# Patient Record
Sex: Female | Born: 1945
Health system: Southern US, Community
[De-identification: ages and names within clinical notes are randomized; demographics above are authoritative.]

## PROBLEM LIST (undated history)

## (undated) DIAGNOSIS — T7840XA Allergy, unspecified, initial encounter: Secondary | ICD-10-CM

## (undated) DIAGNOSIS — R42 Dizziness and giddiness: Secondary | ICD-10-CM

## (undated) DIAGNOSIS — H409 Unspecified glaucoma: Secondary | ICD-10-CM

## (undated) DIAGNOSIS — K635 Polyp of colon: Secondary | ICD-10-CM

## (undated) DIAGNOSIS — Z8673 Personal history of transient ischemic attack (TIA), and cerebral infarction without residual deficits: Secondary | ICD-10-CM

## (undated) DIAGNOSIS — M1990 Unspecified osteoarthritis, unspecified site: Secondary | ICD-10-CM

## (undated) DIAGNOSIS — M4850XA Collapsed vertebra, not elsewhere classified, site unspecified, initial encounter for fracture: Secondary | ICD-10-CM

## (undated) DIAGNOSIS — K388 Other specified diseases of appendix: Secondary | ICD-10-CM

## (undated) HISTORY — DX: Collapsed vertebra, not elsewhere classified, site unspecified, initial encounter for fracture: M48.50XA

## (undated) HISTORY — DX: Unspecified glaucoma: H40.9

## (undated) HISTORY — PX: TUBAL LIGATION: SHX77

## (undated) HISTORY — DX: Allergy, unspecified, initial encounter: T78.40XA

## (undated) HISTORY — PX: EYE SURGERY: SHX253

## (undated) HISTORY — PX: APPENDECTOMY: SHX54

## (undated) HISTORY — DX: Polyp of colon: K63.5

## (undated) HISTORY — DX: Unspecified osteoarthritis, unspecified site: M19.90

---

## 2011-02-24 HISTORY — PX: COLONOSCOPY: SHX174

## 2011-02-24 LAB — HM COLONOSCOPY

## 2013-01-13 DIAGNOSIS — H401190 Primary open-angle glaucoma, unspecified eye, stage unspecified: Secondary | ICD-10-CM | POA: Insufficient documentation

## 2015-10-21 DIAGNOSIS — H3093 Unspecified chorioretinal inflammation, bilateral: Secondary | ICD-10-CM | POA: Diagnosis not present

## 2015-10-21 DIAGNOSIS — H3581 Retinal edema: Secondary | ICD-10-CM | POA: Diagnosis not present

## 2015-10-21 DIAGNOSIS — H35373 Puckering of macula, bilateral: Secondary | ICD-10-CM | POA: Diagnosis not present

## 2015-12-02 DIAGNOSIS — H3093 Unspecified chorioretinal inflammation, bilateral: Secondary | ICD-10-CM | POA: Diagnosis not present

## 2015-12-02 DIAGNOSIS — H3581 Retinal edema: Secondary | ICD-10-CM | POA: Diagnosis not present

## 2015-12-02 DIAGNOSIS — H35373 Puckering of macula, bilateral: Secondary | ICD-10-CM | POA: Diagnosis not present

## 2015-12-20 DIAGNOSIS — Z0000 Encounter for general adult medical examination without abnormal findings: Secondary | ICD-10-CM | POA: Diagnosis not present

## 2015-12-30 DIAGNOSIS — H3581 Retinal edema: Secondary | ICD-10-CM | POA: Diagnosis not present

## 2015-12-30 DIAGNOSIS — H4063X3 Glaucoma secondary to drugs, bilateral, severe stage: Secondary | ICD-10-CM | POA: Diagnosis not present

## 2015-12-30 DIAGNOSIS — H3091 Unspecified chorioretinal inflammation, right eye: Secondary | ICD-10-CM | POA: Diagnosis not present

## 2016-01-13 DIAGNOSIS — H4063X3 Glaucoma secondary to drugs, bilateral, severe stage: Secondary | ICD-10-CM | POA: Diagnosis not present

## 2016-01-13 DIAGNOSIS — H4089 Other specified glaucoma: Secondary | ICD-10-CM | POA: Diagnosis not present

## 2016-02-10 DIAGNOSIS — H3093 Unspecified chorioretinal inflammation, bilateral: Secondary | ICD-10-CM | POA: Diagnosis not present

## 2016-02-10 DIAGNOSIS — H35373 Puckering of macula, bilateral: Secondary | ICD-10-CM | POA: Diagnosis not present

## 2016-02-10 DIAGNOSIS — H3581 Retinal edema: Secondary | ICD-10-CM | POA: Diagnosis not present

## 2016-02-24 DIAGNOSIS — L820 Inflamed seborrheic keratosis: Secondary | ICD-10-CM | POA: Diagnosis not present

## 2016-02-24 DIAGNOSIS — R238 Other skin changes: Secondary | ICD-10-CM | POA: Diagnosis not present

## 2016-02-24 DIAGNOSIS — L298 Other pruritus: Secondary | ICD-10-CM | POA: Diagnosis not present

## 2016-02-24 DIAGNOSIS — L538 Other specified erythematous conditions: Secondary | ICD-10-CM | POA: Diagnosis not present

## 2016-02-24 DIAGNOSIS — D225 Melanocytic nevi of trunk: Secondary | ICD-10-CM | POA: Diagnosis not present

## 2016-02-24 DIAGNOSIS — L570 Actinic keratosis: Secondary | ICD-10-CM | POA: Diagnosis not present

## 2016-03-16 DIAGNOSIS — H4089 Other specified glaucoma: Secondary | ICD-10-CM | POA: Diagnosis not present

## 2016-03-23 DIAGNOSIS — H3581 Retinal edema: Secondary | ICD-10-CM | POA: Diagnosis not present

## 2016-03-23 DIAGNOSIS — H35373 Puckering of macula, bilateral: Secondary | ICD-10-CM | POA: Diagnosis not present

## 2016-03-23 DIAGNOSIS — H44113 Panuveitis, bilateral: Secondary | ICD-10-CM | POA: Diagnosis not present

## 2016-05-04 DIAGNOSIS — H44111 Panuveitis, right eye: Secondary | ICD-10-CM | POA: Diagnosis not present

## 2016-05-04 DIAGNOSIS — H3581 Retinal edema: Secondary | ICD-10-CM | POA: Diagnosis not present

## 2016-05-06 DIAGNOSIS — Z79899 Other long term (current) drug therapy: Secondary | ICD-10-CM | POA: Diagnosis not present

## 2016-05-06 DIAGNOSIS — Z1159 Encounter for screening for other viral diseases: Secondary | ICD-10-CM | POA: Diagnosis not present

## 2016-05-06 DIAGNOSIS — I10 Essential (primary) hypertension: Secondary | ICD-10-CM | POA: Diagnosis not present

## 2016-05-06 DIAGNOSIS — E7800 Pure hypercholesterolemia, unspecified: Secondary | ICD-10-CM | POA: Diagnosis not present

## 2016-05-13 DIAGNOSIS — M542 Cervicalgia: Secondary | ICD-10-CM | POA: Diagnosis not present

## 2016-05-13 DIAGNOSIS — E7800 Pure hypercholesterolemia, unspecified: Secondary | ICD-10-CM | POA: Diagnosis not present

## 2016-06-15 DIAGNOSIS — H209 Unspecified iridocyclitis: Secondary | ICD-10-CM | POA: Diagnosis not present

## 2016-06-15 DIAGNOSIS — H35373 Puckering of macula, bilateral: Secondary | ICD-10-CM | POA: Diagnosis not present

## 2016-06-15 DIAGNOSIS — H3581 Retinal edema: Secondary | ICD-10-CM | POA: Diagnosis not present

## 2016-06-15 DIAGNOSIS — H44111 Panuveitis, right eye: Secondary | ICD-10-CM | POA: Diagnosis not present

## 2016-06-16 DIAGNOSIS — H4089 Other specified glaucoma: Secondary | ICD-10-CM | POA: Diagnosis not present

## 2016-07-02 DIAGNOSIS — Z1231 Encounter for screening mammogram for malignant neoplasm of breast: Secondary | ICD-10-CM | POA: Diagnosis not present

## 2016-07-27 DIAGNOSIS — H44113 Panuveitis, bilateral: Secondary | ICD-10-CM | POA: Diagnosis not present

## 2016-07-27 DIAGNOSIS — H35373 Puckering of macula, bilateral: Secondary | ICD-10-CM | POA: Diagnosis not present

## 2016-07-27 DIAGNOSIS — H3581 Retinal edema: Secondary | ICD-10-CM | POA: Diagnosis not present

## 2016-09-07 DIAGNOSIS — H3581 Retinal edema: Secondary | ICD-10-CM | POA: Diagnosis not present

## 2016-09-07 DIAGNOSIS — H35373 Puckering of macula, bilateral: Secondary | ICD-10-CM | POA: Diagnosis not present

## 2016-09-07 DIAGNOSIS — H209 Unspecified iridocyclitis: Secondary | ICD-10-CM | POA: Diagnosis not present

## 2016-09-07 DIAGNOSIS — H44113 Panuveitis, bilateral: Secondary | ICD-10-CM | POA: Diagnosis not present

## 2016-10-14 DIAGNOSIS — H35352 Cystoid macular degeneration, left eye: Secondary | ICD-10-CM | POA: Diagnosis not present

## 2016-10-14 DIAGNOSIS — H4089 Other specified glaucoma: Secondary | ICD-10-CM | POA: Diagnosis not present

## 2016-10-14 DIAGNOSIS — H401132 Primary open-angle glaucoma, bilateral, moderate stage: Secondary | ICD-10-CM | POA: Diagnosis not present

## 2016-10-14 DIAGNOSIS — Z961 Presence of intraocular lens: Secondary | ICD-10-CM | POA: Diagnosis not present

## 2016-10-19 DIAGNOSIS — H44113 Panuveitis, bilateral: Secondary | ICD-10-CM | POA: Diagnosis not present

## 2016-10-19 DIAGNOSIS — H3581 Retinal edema: Secondary | ICD-10-CM | POA: Diagnosis not present

## 2016-10-19 DIAGNOSIS — H35373 Puckering of macula, bilateral: Secondary | ICD-10-CM | POA: Diagnosis not present

## 2016-11-30 DIAGNOSIS — H3581 Retinal edema: Secondary | ICD-10-CM | POA: Diagnosis not present

## 2016-11-30 DIAGNOSIS — H44111 Panuveitis, right eye: Secondary | ICD-10-CM | POA: Diagnosis not present

## 2016-12-04 DIAGNOSIS — Z719 Counseling, unspecified: Secondary | ICD-10-CM | POA: Diagnosis not present

## 2016-12-04 DIAGNOSIS — S99922A Unspecified injury of left foot, initial encounter: Secondary | ICD-10-CM | POA: Diagnosis not present

## 2016-12-04 DIAGNOSIS — M7732 Calcaneal spur, left foot: Secondary | ICD-10-CM | POA: Diagnosis not present

## 2016-12-04 DIAGNOSIS — M79672 Pain in left foot: Secondary | ICD-10-CM | POA: Diagnosis not present

## 2017-01-11 DIAGNOSIS — H35373 Puckering of macula, bilateral: Secondary | ICD-10-CM | POA: Diagnosis not present

## 2017-01-11 DIAGNOSIS — H44113 Panuveitis, bilateral: Secondary | ICD-10-CM | POA: Diagnosis not present

## 2017-01-11 DIAGNOSIS — H3581 Retinal edema: Secondary | ICD-10-CM | POA: Diagnosis not present

## 2017-01-20 DIAGNOSIS — H44112 Panuveitis, left eye: Secondary | ICD-10-CM | POA: Diagnosis not present

## 2017-01-20 DIAGNOSIS — H3581 Retinal edema: Secondary | ICD-10-CM | POA: Diagnosis not present

## 2017-01-20 DIAGNOSIS — H35372 Puckering of macula, left eye: Secondary | ICD-10-CM | POA: Diagnosis not present

## 2017-01-20 DIAGNOSIS — H44012 Panophthalmitis (acute), left eye: Secondary | ICD-10-CM | POA: Diagnosis not present

## 2017-02-16 DIAGNOSIS — H4089 Other specified glaucoma: Secondary | ICD-10-CM | POA: Diagnosis not present

## 2017-02-22 DIAGNOSIS — H35373 Puckering of macula, bilateral: Secondary | ICD-10-CM | POA: Diagnosis not present

## 2017-02-22 DIAGNOSIS — H209 Unspecified iridocyclitis: Secondary | ICD-10-CM | POA: Diagnosis not present

## 2017-02-22 DIAGNOSIS — H3581 Retinal edema: Secondary | ICD-10-CM | POA: Diagnosis not present

## 2017-02-22 DIAGNOSIS — H44012 Panophthalmitis (acute), left eye: Secondary | ICD-10-CM | POA: Diagnosis not present

## 2017-02-22 DIAGNOSIS — H44113 Panuveitis, bilateral: Secondary | ICD-10-CM | POA: Diagnosis not present

## 2017-03-11 DIAGNOSIS — L820 Inflamed seborrheic keratosis: Secondary | ICD-10-CM | POA: Diagnosis not present

## 2017-03-11 DIAGNOSIS — L814 Other melanin hyperpigmentation: Secondary | ICD-10-CM | POA: Diagnosis not present

## 2017-03-11 DIAGNOSIS — Z08 Encounter for follow-up examination after completed treatment for malignant neoplasm: Secondary | ICD-10-CM | POA: Diagnosis not present

## 2017-03-11 DIAGNOSIS — Z85828 Personal history of other malignant neoplasm of skin: Secondary | ICD-10-CM | POA: Diagnosis not present

## 2017-03-11 DIAGNOSIS — S70361A Insect bite (nonvenomous), right thigh, initial encounter: Secondary | ICD-10-CM | POA: Diagnosis not present

## 2017-03-11 DIAGNOSIS — L821 Other seborrheic keratosis: Secondary | ICD-10-CM | POA: Diagnosis not present

## 2017-03-11 DIAGNOSIS — L718 Other rosacea: Secondary | ICD-10-CM | POA: Diagnosis not present

## 2017-03-11 DIAGNOSIS — L304 Erythema intertrigo: Secondary | ICD-10-CM | POA: Diagnosis not present

## 2017-03-11 DIAGNOSIS — L538 Other specified erythematous conditions: Secondary | ICD-10-CM | POA: Diagnosis not present

## 2017-04-05 DIAGNOSIS — H35373 Puckering of macula, bilateral: Secondary | ICD-10-CM | POA: Diagnosis not present

## 2017-04-05 DIAGNOSIS — H3581 Retinal edema: Secondary | ICD-10-CM | POA: Diagnosis not present

## 2017-04-05 DIAGNOSIS — H44113 Panuveitis, bilateral: Secondary | ICD-10-CM | POA: Diagnosis not present

## 2017-05-13 DIAGNOSIS — E7800 Pure hypercholesterolemia, unspecified: Secondary | ICD-10-CM | POA: Diagnosis not present

## 2017-05-13 DIAGNOSIS — Z79899 Other long term (current) drug therapy: Secondary | ICD-10-CM | POA: Diagnosis not present

## 2017-05-13 DIAGNOSIS — I10 Essential (primary) hypertension: Secondary | ICD-10-CM | POA: Diagnosis not present

## 2017-05-13 LAB — BASIC METABOLIC PANEL
BUN: 11 (ref 4–21)
CREATININE: 0.8 (ref 0.5–1.1)
GLUCOSE: 108
Potassium: 4.1 (ref 3.4–5.3)
SODIUM: 142 (ref 137–147)

## 2017-05-13 LAB — HEPATIC FUNCTION PANEL
ALK PHOS: 69 (ref 25–125)
ALT: 33 (ref 7–35)
AST: 20 (ref 13–35)

## 2017-05-13 LAB — CBC AND DIFFERENTIAL
HCT: 39 (ref 36–46)
Hemoglobin: 12.6 (ref 12.0–16.0)
PLATELETS: 146 — AB (ref 150–399)
WBC: 5.7

## 2017-05-13 LAB — LIPID PANEL
Cholesterol: 206 — AB (ref 0–200)
HDL: 49 (ref 35–70)
LDL Cholesterol: 134
TRIGLYCERIDES: 115 (ref 40–160)

## 2017-05-13 LAB — TSH: TSH: 1.3 (ref 0.41–5.90)

## 2017-05-17 DIAGNOSIS — H44111 Panuveitis, right eye: Secondary | ICD-10-CM | POA: Diagnosis not present

## 2017-05-17 DIAGNOSIS — H3581 Retinal edema: Secondary | ICD-10-CM | POA: Diagnosis not present

## 2017-06-29 DIAGNOSIS — H35373 Puckering of macula, bilateral: Secondary | ICD-10-CM | POA: Diagnosis not present

## 2017-06-29 DIAGNOSIS — H44113 Panuveitis, bilateral: Secondary | ICD-10-CM | POA: Diagnosis not present

## 2017-06-29 DIAGNOSIS — H3581 Retinal edema: Secondary | ICD-10-CM | POA: Diagnosis not present

## 2017-07-23 DIAGNOSIS — H4043X3 Glaucoma secondary to eye inflammation, bilateral, severe stage: Secondary | ICD-10-CM | POA: Diagnosis not present

## 2017-07-23 DIAGNOSIS — Z961 Presence of intraocular lens: Secondary | ICD-10-CM | POA: Diagnosis not present

## 2017-07-23 DIAGNOSIS — H43812 Vitreous degeneration, left eye: Secondary | ICD-10-CM | POA: Diagnosis not present

## 2017-08-10 DIAGNOSIS — H3581 Retinal edema: Secondary | ICD-10-CM | POA: Diagnosis not present

## 2017-09-23 DIAGNOSIS — H3581 Retinal edema: Secondary | ICD-10-CM | POA: Diagnosis not present

## 2017-10-22 DIAGNOSIS — H4043X3 Glaucoma secondary to eye inflammation, bilateral, severe stage: Secondary | ICD-10-CM | POA: Diagnosis not present

## 2017-11-04 DIAGNOSIS — H3581 Retinal edema: Secondary | ICD-10-CM | POA: Diagnosis not present

## 2017-11-04 DIAGNOSIS — H35373 Puckering of macula, bilateral: Secondary | ICD-10-CM | POA: Diagnosis not present

## 2017-11-04 DIAGNOSIS — H44113 Panuveitis, bilateral: Secondary | ICD-10-CM | POA: Diagnosis not present

## 2017-12-09 ENCOUNTER — Encounter: Payer: Self-pay | Admitting: Internal Medicine

## 2017-12-13 ENCOUNTER — Ambulatory Visit (INDEPENDENT_AMBULATORY_CARE_PROVIDER_SITE_OTHER): Payer: Medicare Other | Admitting: Internal Medicine

## 2017-12-13 ENCOUNTER — Encounter: Payer: Self-pay | Admitting: Internal Medicine

## 2017-12-13 VITALS — BP 134/82 | HR 65 | Ht 60.5 in | Wt 134.0 lb

## 2017-12-13 DIAGNOSIS — E785 Hyperlipidemia, unspecified: Secondary | ICD-10-CM | POA: Diagnosis not present

## 2017-12-13 DIAGNOSIS — Z720 Tobacco use: Secondary | ICD-10-CM | POA: Diagnosis not present

## 2017-12-13 DIAGNOSIS — E782 Mixed hyperlipidemia: Secondary | ICD-10-CM | POA: Insufficient documentation

## 2017-12-13 DIAGNOSIS — Z1231 Encounter for screening mammogram for malignant neoplasm of breast: Secondary | ICD-10-CM | POA: Diagnosis not present

## 2017-12-13 DIAGNOSIS — H3093 Unspecified chorioretinal inflammation, bilateral: Secondary | ICD-10-CM

## 2017-12-13 NOTE — Progress Notes (Signed)
Date:  12/13/2017   Name:  Erika Collins   DOB:  1945/11/27   MRN:  [id number]   Chief Complaint: Establish Care Patient presents as a new patient to establish care.  She recently moved here from the [place]. She is due for a mammogram.   She has had several colonoscopies.  She has had a DEXA that showed osteopenia.  Her previous records have been requested. She declines pneumonia vaccines and flu vaccines.  Her main problems is vision loss due to glaucoma and choroiditis.  She has 2 specialists. She has fluctuating sx but is still able to drive and manage all her affairs.  Review of Systems  Constitutional: Negative for chills, fatigue and fever.  Eyes: Positive for visual disturbance.  Respiratory: Negative for cough, chest tightness, shortness of breath and wheezing.   Cardiovascular: Negative for chest pain and palpitations.  Gastrointestinal: Negative for abdominal pain, constipation and diarrhea.  Musculoskeletal: Positive for arthralgias (hands).  Allergic/Immunologic: Negative for environmental allergies.  Neurological: Positive for dizziness. Negative for syncope and headaches.  Hematological: Negative for adenopathy.    Patient Active Problem List   Diagnosis Date Noted  . Primary open angle glaucoma 01/13/2013    Prior to Admission medications   Medication Sig Start Date End Date Taking? Authorizing Provider  aspirin 81 MG tablet Take 81 mg by mouth.   Yes [provider]  Cholecalciferol (VITAMIN D3) 1000 units CAPS Take by mouth.   Yes [provider]  timolol (TIMOPTIC) 0.5 % ophthalmic solution Place 1 drop into both eyes daily.  09/08/17  Yes [provider]    Not on File  Past Surgical History:  Procedure Laterality Date  . TUBAL LIGATION      Social History   Tobacco Use  . Smoking status: Current Every Day Smoker    Packs/day: 0.25  . Smokeless tobacco: Never Used  . Tobacco comment: 3-4 cigerettes daily  Substance Use  Topics  . Alcohol use: Yes    Alcohol/week: 4.2 oz    Types: 7 Glasses of wine per week  . Drug use: Never     Medication list has been reviewed and updated.  PHQ 2/9 Scores 12/13/2017  PHQ - 2 Score 0    Physical Exam  Constitutional: She is oriented to person, place, and time. She appears well-developed. No distress.  HENT:  Head: Normocephalic and atraumatic.  Neck: Normal range of motion. Neck supple.  Cardiovascular: Normal rate, regular rhythm and normal heart sounds.  Pulmonary/Chest: Effort normal and breath sounds normal. No respiratory distress. She has no wheezes.  Musculoskeletal: Normal range of motion. She exhibits no edema or tenderness.  Neurological: She is alert and oriented to person, place, and time.  Skin: Skin is warm and dry. No rash noted.  Psychiatric: She has a normal mood and affect. Her behavior is normal. Thought content normal.  Nursing note and vitals reviewed.   BP 134/82   Pulse 65   Ht 5' 0.5" (1.537 m)   Wt 134 lb (60.8 kg)   SpO2 98%   BMI 25.74 kg/m   Assessment and Plan: 1. Choroiditis of both eyes Followed by glaucoma and retinal specialist  2. Mild hyperlipidemia Continue exercise and healthy diet  3. Encounter for screening mammogram for breast cancer - MM DIGITAL SCREENING BILATERAL; Future  4. Tobacco use Encouraged to reduce use and quit completely but pt is not ready at this time   No orders of the defined types were  placed in this encounter.   Partially dictated using Editor, commissioning. Any errors are unintentional.  Halina Maidens, MD [facility]  12/13/2017

## 2017-12-16 DIAGNOSIS — H30892 Other chorioretinal inflammations, left eye: Secondary | ICD-10-CM | POA: Diagnosis not present

## 2017-12-17 ENCOUNTER — Inpatient Hospital Stay
Admission: RE | Admit: 2017-12-17 | Discharge: 2017-12-17 | Disposition: A | Discharge status: Home / Self Care | Payer: Self-pay | Source: Ambulatory Visit | Attending: *Deleted | Admitting: *Deleted

## 2017-12-17 ENCOUNTER — Other Ambulatory Visit: Payer: Self-pay | Admitting: *Deleted

## 2017-12-17 DIAGNOSIS — Z9289 Personal history of other medical treatment: Secondary | ICD-10-CM

## 2017-12-22 ENCOUNTER — Ambulatory Visit
Admission: RE | Admit: 2017-12-22 | Discharge: 2017-12-22 | Disposition: A | Discharge status: Home / Self Care | Payer: Medicare Other | Source: Ambulatory Visit | Attending: Internal Medicine | Admitting: Internal Medicine

## 2017-12-22 ENCOUNTER — Other Ambulatory Visit: Payer: Self-pay | Admitting: Internal Medicine

## 2017-12-22 DIAGNOSIS — Z1231 Encounter for screening mammogram for malignant neoplasm of breast: Secondary | ICD-10-CM

## 2018-01-21 DIAGNOSIS — H4043X3 Glaucoma secondary to eye inflammation, bilateral, severe stage: Secondary | ICD-10-CM | POA: Diagnosis not present

## 2018-01-27 DIAGNOSIS — H35373 Puckering of macula, bilateral: Secondary | ICD-10-CM | POA: Diagnosis not present

## 2018-01-27 DIAGNOSIS — H3581 Retinal edema: Secondary | ICD-10-CM | POA: Diagnosis not present

## 2018-01-27 DIAGNOSIS — H44113 Panuveitis, bilateral: Secondary | ICD-10-CM | POA: Diagnosis not present

## 2018-03-10 DIAGNOSIS — H44113 Panuveitis, bilateral: Secondary | ICD-10-CM | POA: Diagnosis not present

## 2018-03-10 DIAGNOSIS — H3581 Retinal edema: Secondary | ICD-10-CM | POA: Diagnosis not present

## 2018-03-10 DIAGNOSIS — H30892 Other chorioretinal inflammations, left eye: Secondary | ICD-10-CM | POA: Diagnosis not present

## 2018-04-22 DIAGNOSIS — H4043X3 Glaucoma secondary to eye inflammation, bilateral, severe stage: Secondary | ICD-10-CM | POA: Diagnosis not present

## 2018-04-28 DIAGNOSIS — H30893 Other chorioretinal inflammations, bilateral: Secondary | ICD-10-CM | POA: Diagnosis not present

## 2018-04-28 DIAGNOSIS — H3581 Retinal edema: Secondary | ICD-10-CM | POA: Diagnosis not present

## 2018-04-28 DIAGNOSIS — H44113 Panuveitis, bilateral: Secondary | ICD-10-CM | POA: Diagnosis not present

## 2018-05-11 ENCOUNTER — Ambulatory Visit (INDEPENDENT_AMBULATORY_CARE_PROVIDER_SITE_OTHER): Payer: Medicare Other

## 2018-05-11 VITALS — BP 102/60 | HR 59 | Temp 97.9°F | Resp 12 | Ht 61.0 in | Wt 129.6 lb

## 2018-05-11 DIAGNOSIS — Z0000 Encounter for general adult medical examination without abnormal findings: Secondary | ICD-10-CM

## 2018-05-11 DIAGNOSIS — E2839 Other primary ovarian failure: Secondary | ICD-10-CM | POA: Diagnosis not present

## 2018-05-11 DIAGNOSIS — M8589 Other specified disorders of bone density and structure, multiple sites: Secondary | ICD-10-CM | POA: Diagnosis not present

## 2018-05-11 NOTE — Progress Notes (Signed)
Subjective:   Erika Collins is a 72 y.o. female who presents for Medicare Annual (Subsequent) preventive examination.  Review of Systems:   N/A Cardiac Risk Factors include: advanced age (>37men, >34 women);dyslipidemia;sedentary lifestyle     Objective:     Vitals: BP 102/60 (BP Location: Left Arm, Patient Position: Sitting, Cuff Size: Normal)   Pulse (!) 59   Temp 97.9 F (36.6 C) (Oral)   Resp 12   Ht 5\' 1"  (1.549 m)   Wt 129 lb 9.6 oz (58.8 kg)   SpO2 98%   BMI 24.49 kg/m   Body mass index is 24.49 kg/m.  Advanced Directives 05/11/2018  Does Patient Have a Medical Advance Directive? No  Would patient like information on creating a medical advance directive? Yes (MAU/Ambulatory/Procedural Areas - Information given)    Tobacco Social History   Tobacco Use  Smoking Status Current Every Day Smoker  . Packs/day: 0.25  . Years: 45.00  . Pack years: 11.25  . Types: Cigarettes  Smokeless Tobacco Never Used  Tobacco Comment   3-4 cigerettes daily     Ready to quit: No Counseling given: Yes Comment: 3-4 cigerettes daily  Clinical Intake:  Pre-visit preparation completed: Yes  Pain : No/denies pain   BMI - recorded: 24.49 Nutritional Status: BMI 25 -29 Overweight Nutritional Risks: None Diabetes: No  How often do you need to have someone help you when you read instructions, pamphlets, or other written materials from your doctor or pharmacy?: 1 - Never  Interpreter Needed?: No  Information entered by :: AEversole, LPN  History reviewed. No pertinent past medical history. Past Surgical History:  Procedure Laterality Date  . TUBAL LIGATION     Family History  Problem Relation Age of Onset  . Heart attack Father   . Hyperlipidemia Sister   . Breast cancer Neg Hx    Social History   Socioeconomic History  . Marital status: Widowed    Spouse name: Not on file  . Number of children: 1  . Years of education: some college  . Highest education level:  12th grade  Occupational History  . Occupation: Retired  Scientific laboratory technician  . Financial resource strain: Not hard at all  . Food insecurity:    Worry: Never true    Inability: Never true  . Transportation needs:    Medical: No    Non-medical: No  Tobacco Use  . Smoking status: Current Every Day Smoker    Packs/day: 0.25    Years: 45.00    Pack years: 11.25    Types: Cigarettes  . Smokeless tobacco: Never Used  . Tobacco comment: 3-4 cigerettes daily  Substance and Sexual Activity  . Alcohol use: Yes    Alcohol/week: 7.0 - 10.0 standard drinks    Types: 7 - 10 Glasses of wine per week  . Drug use: Never  . Sexual activity: Not Currently  Lifestyle  . Physical activity:    Days per week: 0 days    Minutes per session: 0 min  . Stress: Not at all  Relationships  . Social connections:    Talks on phone: Patient refused    Gets together: Patient refused    Attends religious service: Patient refused    Active member of club or organization: Patient refused    Attends meetings of clubs or organizations: Patient refused    Relationship status: Widowed  Other Topics Concern  . Not on file  Social History Narrative  . Not on file  Outpatient Encounter Medications as of 05/11/2018  Medication Sig  . aspirin 81 MG tablet Take 81 mg by mouth.  . Cholecalciferol (VITAMIN D3) 1000 units CAPS Take by mouth.  . timolol (TIMOPTIC) 0.5 % ophthalmic solution Place 1 drop into both eyes daily.    No facility-administered encounter medications on file as of 05/11/2018.     Activities of Daily Living In your present state of health, do you have any difficulty performing the following activities: 05/11/2018 12/13/2017  Hearing? N N  Comment denies hearing aids -  Vision? N N  Comment wears eyeglasses -  Difficulty concentrating or making decisions? N N  Walking or climbing stairs? N N  Dressing or bathing? N N  Doing errands, shopping? N N  Preparing Food and eating ? N -  Comment  denies dentures -  Using the Toilet? N -  In the past six months, have you accidently leaked urine? Y -  Comment stress incontinence -  Do you have problems with loss of bowel control? N -  Managing your Medications? N -  Managing your Finances? N -  Housekeeping or managing your Housekeeping? N -    Patient Care Team: Glean Hess, MD as PCP - General (Internal Medicine) Karren Burly Deirdre Peer, MD as Consulting Physician (Ophthalmology) Dr. Kallie Locks as Consulting Physician ([facility])    Assessment:   This is a routine wellness examination for Mieshia.  Exercise Activities and Dietary recommendations Current Exercise Habits: The patient does not participate in regular exercise at present, Exercise limited by: None identified  Goals    . DIET - INCREASE WATER INTAKE     Recommend to drink at least 6-8 8oz glasses of water per day.       Fall Risk Fall Risk  05/11/2018 12/13/2017  Falls in the past year? No No  Risk for fall due to : Impaired vision;History of fall(s);Medication side effect -  Risk for fall due to: Comment wears eyeglasses; tripped over pet; eye gtts cause dizziness -   FALL RISK PREVENTION PERTAINING TO HOME: Is your home free of loose throw rugs in walkways, pet beds, electrical cords, etc? Yes Is there adequate lighting in your home to reduce risk of falls?  Yes Are there stairs in or around your home WITH handrails? No stairs  ASSISTIVE DEVICES UTILIZED TO PREVENT FALLS: Use of a cane, walker or w/c? No Grab bars in the bathroom? Yes  Shower chair or a place to sit while bathing? Yes An elevated toilet seat or a handicapped toilet? No  Timed Get Up and Go Performed: Yes. Pt ambulated 10 feet within 10 sec. Gait stead-fast and without the use of an assistive device. No intervention required at this time. Fall risk prevention has been discussed.  Community Resource Referral:  Pt declined my offer to send [employer] Referral  to Care Guide for an elevated toilet seat.  Depression Screen PHQ 2/9 Scores 05/11/2018 12/13/2017  PHQ - 2 Score 0 0  PHQ- 9 Score 0 -     Cognitive Function     6CIT Screen 05/11/2018  What Year? 0 points  What month? 0 points  What time? 0 points  Count back from 20 0 points  Months in reverse 0 points  Repeat phrase 0 points  Total Score 0     There is no immunization history on file for this patient.  Qualifies for Shingles Vaccine? Yes. Due for Shingrix. Education has been provided regarding the importance  of this vaccine. Pt has been advised to call insurance company to determine out of pocket expense. Advised may also receive vaccine at local pharmacy or Health Dept. Verbalized acceptance and understanding.  Overdue for Flu vaccine. Education has been provided regarding the importance of this vaccine and advised to receive when available. Verbalized acceptance and understanding.  Due for Pneumoccocal vaccine. Declined my offer to administer today. Education has been provided regarding the importance of this vaccine but still declined. Advised may receive this vaccine at local pharmacy or Health Dept. Aware to provide a copy of the vaccination record if obtained from local pharmacy or Health Dept. Verbalized acceptance and understanding.  Due for Tdap vaccine. Education has been provided regarding the importance of this vaccine. Advised may receive this vaccine at local pharmacy or Health Dept. Aware to provide a copy of the vaccination record if obtained from local pharmacy or Health Dept. Verbalized acceptance and understanding.  Screening Tests Health Maintenance  Topic Date Due  . INFLUENZA VACCINE  06/21/2018 (Originally 04/21/2018)  . TETANUS/TDAP  12/14/2018 (Originally 10[birth year]09/1964)  . PNA vac Low Risk Adult (1 of 2 - PCV13) 12/14/2018 (Originally 10/09/2010)  . COLONOSCOPY  05/12/2019 (Originally 04/21/2018)  . Hepatitis C Screening  05/12/2019 (Originally [birth year])  .  MAMMOGRAM  12/23/2018  . DEXA SCAN  Completed    Cancer Screenings: Lung: Low Dose CT Chest recommended if Age 55-80 years, 30 pack-year currently smoking OR have quit w/in 15years. Patient does not qualify. Breast:  Up to date on Mammogram? Yes. Completed 12/22/17. Repeat every year   Up to date of Bone Density/Dexa? No. Completed 06/03/15. Results reflect osteopenia. Repeat every 2 years. Ordered today. Aware she will receive a call from our office re: her appt. Colorectal: Completed 04/21/08. Repeat every 10 years  Additional Screenings: Hepatitis C Screening: Declined   Plan:  I have personally reviewed and addressed the Medicare Annual Wellness questionnaire and have noted the following in the patient's chart:  A. Medical and social history B. Use of alcohol, tobacco or illicit drugs  C. Current medications and supplements D. Functional ability and status E.  Nutritional status F.  Physical activity G. Advance directives H. List of other physicians I.  Hospitalizations, surgeries, and ER visits in previous 12 months J.  Rea such as hearing and vision if needed, cognitive and depression L. Referrals and appointments  In addition, I have reviewed and discussed with patient certain preventive protocols, quality metrics, and best practice recommendations. A written personalized care plan for preventive services as well as general preventive health recommendations were provided to patient.  Signed,  Aleatha Borer, LPN Nurse Health Advisor  MD Recommendations: Due for Shingrix. Education has been provided regarding the importance of this vaccine. Pt has been advised to call insurance company to determine out of pocket expense. Advised may also receive vaccine at local pharmacy or Health Dept. Verbalized acceptance and understanding.  Overdue for Flu vaccine. Education has been provided regarding the importance of this vaccine and advised to receive when available.  Verbalized acceptance and understanding.  Due for Pneumoccocal vaccine. Declined my offer to administer today. Education has been provided regarding the importance of this vaccine but still declined. Advised may receive this vaccine at local pharmacy or Health Dept. Aware to provide a copy of the vaccination record if obtained from local pharmacy or Health Dept. Verbalized acceptance and understanding.  Due for Tdap vaccine. Education has been provided regarding the importance of this vaccine. Advised may  receive this vaccine at local pharmacy or Health Dept. Aware to provide a copy of the vaccination record if obtained from local pharmacy or Health Dept. Verbalized acceptance and understanding.  Bone Density/Dexa. Completed 06/03/15. Results reflect osteopenia. Repeat every 2 years. Ordered today. Aware she will receive a call from our office re: her appt.

## 2018-05-11 NOTE — Patient Instructions (Signed)
[name] , Thank you for taking time to come for your Medicare Wellness Visit. I appreciate your ongoing commitment to your health goals. Please review the following plan we discussed and let me know if I can assist you in the future.   Screening recommendations/referrals: Colorectal Screening: Up to date Mammogram: Up to date Bone Density: You will receive a call from our office regarding your appointment  Vision and Dental Exams: Recommended annual ophthalmology exams for early detection of glaucoma and other disorders of the eye Recommended annual dental exams for proper oral hygiene  Vaccinations: Influenza vaccine: Overdue Pneumococcal vaccine: Declined Tdap vaccine: Declined. Please call your insurance company to determine your out of pocket expense. You may also receive this vaccine at your local pharmacy or Health Dept. Shingles vaccine: Please call your insurance company to determine your out of pocket expense for the Shingrix vaccine. You may also receive this vaccine at your local pharmacy or Health Dept.    Advanced directives: Please bring a copy of your POA (Power of Attorney) and/or Living Will to your next appointment.  Goals: Recommend to drink at least 6-8 8oz glasses of water per day.  Next appointment: Please schedule your Annual Wellness Visit with your Nurse Health Advisor in one year.  Preventive Care 59 Years and Older, Female Preventive care refers to lifestyle choices and visits with your health care provider that can promote health and wellness. What does preventive care include?  A yearly physical exam. This is also called an annual well check.  Dental exams once or twice a year.  Routine eye exams. Ask your health care provider how often you should have your eyes checked.  Personal lifestyle choices, including:  Daily care of your teeth and gums.  Regular physical activity.  Eating a healthy diet.  Avoiding tobacco and drug use.  Limiting  alcohol use.  Practicing safe sex.  Taking low-dose aspirin every day.  Taking vitamin and mineral supplements as recommended by your health care provider. What happens during an annual well check? The services and screenings done by your health care provider during your annual well check will depend on your age, overall health, lifestyle risk factors, and family history of disease. Counseling  Your health care provider may ask you questions about your:  Alcohol use.  Tobacco use.  Drug use.  Emotional well-being.  Home and relationship well-being.  Sexual activity.  Eating habits.  History of falls.  Memory and ability to understand (cognition).  Work and work Statistician.  Reproductive health. Screening  You may have the following tests or measurements:  Height, weight, and BMI.  Blood pressure.  Lipid and cholesterol levels. These may be checked every 5 years, or more frequently if you are over 72 years old.  Skin check.  Lung cancer screening. You may have this screening every year starting at age 72 if you have a 30-pack-year history of smoking and currently smoke or have quit within the past 15 years.  Fecal occult blood test (FOBT) of the stool. You may have this test every year starting at age 72.  Flexible sigmoidoscopy or colonoscopy. You may have a sigmoidoscopy every 5 years or a colonoscopy every 10 years starting at age 72.  Hepatitis C blood test.  Hepatitis B blood test.  Sexually transmitted disease (STD) testing.  Diabetes screening. This is done by checking your blood sugar (glucose) after you have not eaten for a while (fasting). You may have this done every 1-3 years.  Bone density scan. This is done to screen for osteoporosis. You may have this done starting at age 72.  Mammogram. This may be done every 1-2 years. Talk to your health care provider about how often you should have regular mammograms. Talk with your health care provider  about your test results, treatment options, and if necessary, the need for more tests. Vaccines  Your health care provider may recommend certain vaccines, such as:  Influenza vaccine. This is recommended every year.  Tetanus, diphtheria, and acellular pertussis (Tdap, Td) vaccine. You may need a Td booster every 10 years.  Zoster vaccine. You may need this after age 72.  Pneumococcal 13-valent conjugate (PCV13) vaccine. One dose is recommended after age 72.  Pneumococcal polysaccharide (PPSV23) vaccine. One dose is recommended after age 72. Talk to your health care provider about which screenings and vaccines you need and how often you need them. This information is not intended to replace advice given to you by your health care provider. Make sure you discuss any questions you have with your health care provider. Document Released: 10/04/2015 Document Revised: 05/27/2016 Document Reviewed: 07/09/2015 Elsevier Interactive Patient Education  2017 [place] Prevention in the Home Falls can cause injuries. They can happen to people of all ages. There are many things you can do to make your home safe and to help prevent falls. What can I do on the outside of my home?  Regularly fix the edges of walkways and driveways and fix any cracks.  Remove anything that might make you trip as you walk through a door, such as a raised step or threshold.  Trim any bushes or trees on the path to your home.  Use bright outdoor lighting.  Clear any walking paths of anything that might make someone trip, such as rocks or tools.  Regularly check to see if handrails are loose or broken. Make sure that both sides of any steps have handrails.  Any raised decks and porches should have guardrails on the edges.  Have any leaves, snow, or ice cleared regularly.  Use sand or salt on walking paths during winter.  Clean up any spills in your garage right away. This includes oil or grease  spills. What can I do in the bathroom?  Use night lights.  Install grab bars by the toilet and in the tub and shower. Do not use towel bars as grab bars.  Use non-skid mats or decals in the tub or shower.  If you need to sit down in the shower, use a plastic, non-slip stool.  Keep the floor dry. Clean up any water that spills on the floor as soon as it happens.  Remove soap buildup in the tub or shower regularly.  Attach bath mats securely with double-sided non-slip rug tape.  Do not have throw rugs and other things on the floor that can make you trip. What can I do in the bedroom?  Use night lights.  Make sure that you have a light by your bed that is easy to reach.  Do not use any sheets or blankets that are too big for your bed. They should not hang down onto the floor.  Have a firm chair that has side arms. You can use this for support while you get dressed.  Do not have throw rugs and other things on the floor that can make you trip. What can I do in the kitchen?  Clean up any spills right away.  Avoid walking  on wet floors.  Keep items that you use a lot in easy-to-reach places.  If you need to reach something above you, use a strong step stool that has a grab bar.  Keep electrical cords out of the way.  Do not use floor polish or wax that makes floors slippery. If you must use wax, use non-skid floor wax.  Do not have throw rugs and other things on the floor that can make you trip. What can I do with my stairs?  Do not leave any items on the stairs.  Make sure that there are handrails on both sides of the stairs and use them. Fix handrails that are broken or loose. Make sure that handrails are as long as the stairways.  Check any carpeting to make sure that it is firmly attached to the stairs. Fix any carpet that is loose or worn.  Avoid having throw rugs at the top or bottom of the stairs. If you do have throw rugs, attach them to the floor with carpet  tape.  Make sure that you have a light switch at the top of the stairs and the bottom of the stairs. If you do not have them, ask someone to add them for you. What else can I do to help prevent falls?  Wear shoes that:  Do not have high heels.  Have rubber bottoms.  Are comfortable and fit you well.  Are closed at the toe. Do not wear sandals.  If you use a stepladder:  Make sure that it is fully opened. Do not climb a closed stepladder.  Make sure that both sides of the stepladder are locked into place.  Ask someone to hold it for you, if possible.  Clearly mark and make sure that you can see:  Any grab bars or handrails.  First and last steps.  Where the edge of each step is.  Use tools that help you move around (mobility aids) if they are needed. These include:  Canes.  Walkers.  Scooters.  Crutches.  Turn on the lights when you go into a dark area. Replace any light bulbs as soon as they burn out.  Set up your furniture so you have a clear path. Avoid moving your furniture around.  If any of your floors are uneven, fix them.  If there are any pets around you, be aware of where they are.  Review your medicines with your doctor. Some medicines can make you feel dizzy. This can increase your chance of falling. Ask your doctor what other things that you can do to help prevent falls. This information is not intended to replace advice given to you by your health care provider. Make sure you discuss any questions you have with your health care provider. Document Released: 07/04/2009 Document Revised: 02/13/2016 Document Reviewed: 10/12/2014 Elsevier Interactive Patient Education  2017 [employer].

## 2018-05-17 ENCOUNTER — Encounter: Payer: Self-pay | Admitting: Internal Medicine

## 2018-05-17 ENCOUNTER — Ambulatory Visit (INDEPENDENT_AMBULATORY_CARE_PROVIDER_SITE_OTHER): Payer: Medicare Other | Admitting: Internal Medicine

## 2018-05-17 VITALS — BP 118/78 | HR 78 | Resp 16 | Ht 61.0 in | Wt 128.0 lb

## 2018-05-17 DIAGNOSIS — M5092 Unspecified cervical disc disorder, mid-cervical region, unspecified level: Secondary | ICD-10-CM | POA: Diagnosis not present

## 2018-05-17 DIAGNOSIS — Z8673 Personal history of transient ischemic attack (TIA), and cerebral infarction without residual deficits: Secondary | ICD-10-CM | POA: Diagnosis not present

## 2018-05-17 DIAGNOSIS — Z23 Encounter for immunization: Secondary | ICD-10-CM

## 2018-05-17 DIAGNOSIS — H3093 Unspecified chorioretinal inflammation, bilateral: Secondary | ICD-10-CM | POA: Diagnosis not present

## 2018-05-17 DIAGNOSIS — M5010 Cervical disc disorder with radiculopathy, unspecified cervical region: Secondary | ICD-10-CM | POA: Insufficient documentation

## 2018-05-17 DIAGNOSIS — Z720 Tobacco use: Secondary | ICD-10-CM

## 2018-05-17 DIAGNOSIS — E559 Vitamin D deficiency, unspecified: Secondary | ICD-10-CM | POA: Diagnosis not present

## 2018-05-17 DIAGNOSIS — E785 Hyperlipidemia, unspecified: Secondary | ICD-10-CM | POA: Diagnosis not present

## 2018-05-17 LAB — POCT URINALYSIS DIPSTICK
BILIRUBIN UA: NEGATIVE
Blood, UA: NEGATIVE
Glucose, UA: NEGATIVE
KETONES UA: NEGATIVE
Leukocytes, UA: NEGATIVE
Nitrite, UA: NEGATIVE
PH UA: 7 (ref 5.0–8.0)
Protein, UA: NEGATIVE
Spec Grav, UA: 1.01 (ref 1.010–1.025)
UROBILINOGEN UA: 0.2 U/dL

## 2018-05-17 NOTE — Progress Notes (Signed)
Date:  05/17/2018   Name:  Erika Collins   DOB:  1946-02-09   MRN:  [id number]   Chief Complaint: Annual Exam (? about aspirin low dose being taken at her age ) [name] is a 72 y.o. female who presents today for her yearly check up. She feels well. She reports exercising walking. She reports she is sleeping well.  Mammogram is up to date. She no longer needs Pap.   Hx of TIA - in 2010 had mild stroke with right arm and leg sx that lasted a day. She has been taking an Aspirin 81 mg daily.  Carotid US was normal at that time.  She has had no further sx.  Cervical disc disease - has mild disc disease and intermittent left arm sx.  Take Aleve as needed.  Vitamin D def - on daily supplement.  DEXA has been ordered.  Review of Systems  Constitutional: Negative for chills, fatigue and fever.  HENT: Negative for congestion, hearing loss, tinnitus, trouble swallowing and voice change.   Eyes: Negative for visual disturbance.  Respiratory: Negative for cough, chest tightness, shortness of breath and wheezing.   Cardiovascular: Negative for chest pain, palpitations and leg swelling.  Gastrointestinal: Negative for abdominal pain, constipation, diarrhea and vomiting.  Endocrine: Negative for polydipsia and polyuria.  Genitourinary: Negative for dysuria, frequency, genital sores, vaginal bleeding and vaginal discharge.  Musculoskeletal: Positive for back pain and neck pain. Negative for arthralgias, gait problem and joint swelling.  Skin: Negative for color change and rash.  Neurological: Negative for dizziness, tremors, light-headedness and headaches.  Hematological: Negative for adenopathy. Does not bruise/bleed easily.  Psychiatric/Behavioral: Negative for dysphoric mood and sleep disturbance. The patient is not nervous/anxious.     Patient Active Problem List   Diagnosis Date Noted  . 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  . Mild hyperlipidemia 12/13/2017  . Tobacco use 12/13/2017  .  Primary open angle glaucoma 01/13/2013    No Known Allergies  Past Surgical History:  Procedure Laterality Date  . TUBAL LIGATION      Social History   Tobacco Use  . Smoking status: Current Every Day Smoker    Packs/day: 0.25    Years: 45.00    Pack years: 11.25    Types: Cigarettes  . Smokeless tobacco: Never Used  . Tobacco comment: 3-4 cigerettes daily  Substance Use Topics  . Alcohol use: Yes    Alcohol/week: 7.0 - 10.0 standard drinks    Types: 7 - 10 Glasses of wine per week  . Drug use: Never     Medication list has been reviewed and updated.  Current Meds  Medication Sig  . aspirin 81 MG tablet Take 81 mg by mouth daily.   . Cholecalciferol (VITAMIN D3) 1000 units CAPS Take by mouth daily.   . timolol (TIMOPTIC) 0.5 % ophthalmic solution Place 1 drop into both eyes 2 (two) times daily.     PHQ 2/9 Scores 05/17/2018 05/11/2018 12/13/2017  PHQ - 2 Score 0 0 0  PHQ- 9 Score 0 0 -    Physical Exam  Constitutional: She is oriented to person, place, and time. She appears well-developed and well-nourished. No distress.  HENT:  Head: Normocephalic and atraumatic.  Right Ear: Tympanic membrane and ear canal normal.  Left Ear: Tympanic membrane and ear canal normal.  Nose: Right sinus exhibits no maxillary sinus tenderness. Left sinus exhibits no maxillary sinus tenderness.  Mouth/Throat: Uvula is midline and oropharynx is clear and moist.  Eyes: Conjunctivae and EOM are normal. Right eye exhibits no discharge. Left eye exhibits no discharge. No scleral icterus.  Neck: Normal range of motion. Carotid bruit is not present. No erythema present. No thyromegaly present.  Cardiovascular: Normal rate, regular rhythm, normal heart sounds and normal pulses.  No murmur heard. Pulmonary/Chest: Effort normal. No respiratory distress. She has no wheezes. Right breast exhibits no mass, no nipple discharge, no skin change and no tenderness. Left breast exhibits no mass, no nipple  discharge, no skin change and no tenderness.  Abdominal: Soft. Bowel sounds are normal. There is no hepatosplenomegaly. There is no tenderness. There is no CVA tenderness.  Musculoskeletal: Normal range of motion.  Lymphadenopathy:    She has no cervical adenopathy.    She has no axillary adenopathy.  Neurological: She is alert and oriented to person, place, and time. She has normal reflexes. No cranial nerve deficit or sensory deficit.  Skin: Skin is warm, dry and intact. No rash noted.  Psychiatric: She has a normal mood and affect. Her speech is normal and behavior is normal. Thought content normal.  Nursing note and vitals reviewed.   BP 118/78   Pulse 78   Resp 16   Ht 5\' 1"  (1.549 m)   Wt 128 lb (58.1 kg)   SpO2 98%   BMI 24.19 kg/m   Assessment and Plan: 1. Mild hyperlipidemia Check labs and advise - Lipid panel - TSH  2. Choroiditis of both eyes Follow up with ophthalmology  3. Tobacco use She continues to be a light smoker, not interested in quitting at this time - Nurse to provide smoking / tobacco cessation education - POCT urinalysis dipstick  4. History of TIA (transient ischemic attack) Continue low dose aspirin - CBC with Differential/Platelet - Comprehensive metabolic panel  5. Cervical disc disorder of mid-cervical region Stable, continue heat and Aleve as needed  6. Need for pneumococcal vaccination - Pneumococcal polysaccharide vaccine 23-valent greater than or equal to 2yo subcutaneous/IM  7. Vitamin D deficiency - VITAMIN D 25 Hydroxy (Vit-D Deficiency, Fractures)   No orders of the defined types were placed in this encounter.   Partially dictated using Editor, commissioning. Any errors are unintentional.  Halina Maidens, MD [facility]  05/17/2018    There are no diagnoses linked to this encounter.

## 2018-05-17 NOTE — Patient Instructions (Signed)

## 2018-05-18 LAB — COMPREHENSIVE METABOLIC PANEL
ALBUMIN: 4.3 g/dL (ref 3.5–4.8)
ALT: 17 IU/L (ref 0–32)
AST: 17 IU/L (ref 0–40)
Albumin/Globulin Ratio: 1.3 (ref 1.2–2.2)
Alkaline Phosphatase: 69 IU/L (ref 39–117)
BUN / CREAT RATIO: 15 (ref 12–28)
BUN: 11 mg/dL (ref 8–27)
Bilirubin Total: 0.3 mg/dL (ref 0.0–1.2)
CALCIUM: 9.8 mg/dL (ref 8.7–10.3)
CO2: 20 mmol/L (ref 20–29)
CREATININE: 0.74 mg/dL (ref 0.57–1.00)
Chloride: 107 mmol/L — ABNORMAL HIGH (ref 96–106)
GFR calc Af Amer: 94 mL/min/{1.73_m2} (ref >59)
GFR, EST NON AFRICAN AMERICAN: 81 mL/min/{1.73_m2} (ref >59)
GLOBULIN, TOTAL: 3.3 g/dL (ref 1.5–4.5)
Glucose: 103 mg/dL — ABNORMAL HIGH (ref 65–99)
Potassium: 4.8 mmol/L (ref 3.5–5.2)
SODIUM: 144 mmol/L (ref 134–144)
Total Protein: 7.6 g/dL (ref 6.0–8.5)

## 2018-05-18 LAB — LIPID PANEL
CHOL/HDL RATIO: 4.6 ratio — AB (ref 0.0–4.4)
Cholesterol, Total: 227 mg/dL — ABNORMAL HIGH (ref 100–199)
HDL: 49 mg/dL (ref >39)
LDL CALC: 142 mg/dL — AB (ref 0–99)
Triglycerides: 178 mg/dL — ABNORMAL HIGH (ref 0–149)
VLDL Cholesterol Cal: 36 mg/dL (ref 5–40)

## 2018-05-18 LAB — CBC WITH DIFFERENTIAL/PLATELET
BASOS: 1 %
Basophils Absolute: 0.1 10*3/uL (ref 0.0–0.2)
EOS (ABSOLUTE): 0.4 10*3/uL (ref 0.0–0.4)
EOS: 6 %
HEMATOCRIT: 39.7 % (ref 34.0–46.6)
HEMOGLOBIN: 13.2 g/dL (ref 11.1–15.9)
IMMATURE GRANULOCYTES: 0 %
Immature Grans (Abs): 0 10*3/uL (ref 0.0–0.1)
LYMPHS ABS: 1.5 10*3/uL (ref 0.7–3.1)
Lymphs: 23 %
MCH: 31.3 pg (ref 26.6–33.0)
MCHC: 33.2 g/dL (ref 31.5–35.7)
MCV: 94 fL (ref 79–97)
MONOCYTES: 9 %
Monocytes Absolute: 0.6 10*3/uL (ref 0.1–0.9)
Neutrophils Absolute: 4.1 10*3/uL (ref 1.4–7.0)
Neutrophils: 61 %
Platelets: 158 10*3/uL (ref 150–450)
RBC: 4.22 x10E6/uL (ref 3.77–5.28)
RDW: 12.8 % (ref 12.3–15.4)
WBC: 6.6 10*3/uL (ref 3.4–10.8)

## 2018-05-18 LAB — VITAMIN D 25 HYDROXY (VIT D DEFICIENCY, FRACTURES): Vit D, 25-Hydroxy: 29.8 ng/mL — ABNORMAL LOW (ref 30.0–100.0)

## 2018-05-18 LAB — TSH: TSH: 1.32 u[IU]/mL (ref 0.450–4.500)

## 2018-05-24 ENCOUNTER — Other Ambulatory Visit: Payer: Self-pay | Admitting: Internal Medicine

## 2018-05-24 ENCOUNTER — Telehealth: Payer: Self-pay

## 2018-05-24 DIAGNOSIS — E785 Hyperlipidemia, unspecified: Secondary | ICD-10-CM

## 2018-05-24 NOTE — Telephone Encounter (Signed)
Pt will come back first week of December for recheck LAB ONLY Lipid.

## 2018-05-24 NOTE — Telephone Encounter (Signed)
Called patient and informed of lab results. (Accidentally closed out the lab from 05/17/2018)  She is informed of low Vit D and to double up on her supplement. Also, discussed cholesterol and she wishes to work on diet and exercise at this time and have a recheck to see if it is getting better.  Please Advise. JUST FYI.

## 2018-05-24 NOTE — Telephone Encounter (Signed)
That is fine to work on diet and exercise.  Recommend recheck in 4 months and if not improving, will discuss medication.

## 2018-06-07 ENCOUNTER — Ambulatory Visit
Admission: RE | Admit: 2018-06-07 | Discharge: 2018-06-07 | Disposition: A | Discharge status: Home / Self Care | Payer: Medicare Other | Source: Ambulatory Visit | Attending: Internal Medicine | Admitting: Internal Medicine

## 2018-06-07 DIAGNOSIS — E2839 Other primary ovarian failure: Secondary | ICD-10-CM | POA: Insufficient documentation

## 2018-06-07 DIAGNOSIS — M8589 Other specified disorders of bone density and structure, multiple sites: Secondary | ICD-10-CM | POA: Diagnosis not present

## 2018-06-07 DIAGNOSIS — M85851 Other specified disorders of bone density and structure, right thigh: Secondary | ICD-10-CM | POA: Diagnosis not present

## 2018-06-07 DIAGNOSIS — M85832 Other specified disorders of bone density and structure, left forearm: Secondary | ICD-10-CM | POA: Diagnosis not present

## 2018-06-09 ENCOUNTER — Encounter: Payer: Self-pay | Admitting: Internal Medicine

## 2018-06-10 DIAGNOSIS — H3581 Retinal edema: Secondary | ICD-10-CM | POA: Diagnosis not present

## 2018-07-22 DIAGNOSIS — H3581 Retinal edema: Secondary | ICD-10-CM | POA: Diagnosis not present

## 2018-07-22 DIAGNOSIS — H30893 Other chorioretinal inflammations, bilateral: Secondary | ICD-10-CM | POA: Diagnosis not present

## 2018-08-22 ENCOUNTER — Other Ambulatory Visit: Payer: Self-pay | Admitting: Internal Medicine

## 2018-08-22 DIAGNOSIS — E785 Hyperlipidemia, unspecified: Secondary | ICD-10-CM

## 2018-08-23 LAB — LIPID PANEL
CHOLESTEROL TOTAL: 162 mg/dL (ref 100–199)
Chol/HDL Ratio: 4.1 ratio (ref 0.0–4.4)
HDL: 40 mg/dL (ref >39)
LDL Calculated: 98 mg/dL (ref 0–99)
Triglycerides: 120 mg/dL (ref 0–149)
VLDL Cholesterol Cal: 24 mg/dL (ref 5–40)

## 2018-08-26 ENCOUNTER — Telehealth: Payer: Self-pay

## 2018-08-26 DIAGNOSIS — Z961 Presence of intraocular lens: Secondary | ICD-10-CM | POA: Diagnosis not present

## 2018-08-26 DIAGNOSIS — H4043X3 Glaucoma secondary to eye inflammation, bilateral, severe stage: Secondary | ICD-10-CM | POA: Diagnosis not present

## 2018-08-26 NOTE — Telephone Encounter (Signed)
Patient informed of better cholesterol from recent visit. Accidentally closed the lab note so added in this call msg. She will continue diet and exercise.   FYI for LAB RESULTS

## 2018-09-08 DIAGNOSIS — H44113 Panuveitis, bilateral: Secondary | ICD-10-CM | POA: Diagnosis not present

## 2018-09-08 DIAGNOSIS — H3581 Retinal edema: Secondary | ICD-10-CM | POA: Diagnosis not present

## 2018-09-08 DIAGNOSIS — H35373 Puckering of macula, bilateral: Secondary | ICD-10-CM | POA: Diagnosis not present

## 2018-10-20 DIAGNOSIS — H3581 Retinal edema: Secondary | ICD-10-CM | POA: Diagnosis not present

## 2018-12-06 DIAGNOSIS — H3581 Retinal edema: Secondary | ICD-10-CM | POA: Diagnosis not present

## 2018-12-06 DIAGNOSIS — H44112 Panuveitis, left eye: Secondary | ICD-10-CM | POA: Diagnosis not present

## 2018-12-29 ENCOUNTER — Encounter: Payer: Self-pay | Admitting: Internal Medicine

## 2019-01-17 DIAGNOSIS — H35373 Puckering of macula, bilateral: Secondary | ICD-10-CM | POA: Diagnosis not present

## 2019-01-17 DIAGNOSIS — H44113 Panuveitis, bilateral: Secondary | ICD-10-CM | POA: Diagnosis not present

## 2019-01-17 DIAGNOSIS — H30891 Other chorioretinal inflammations, right eye: Secondary | ICD-10-CM | POA: Diagnosis not present

## 2019-01-17 DIAGNOSIS — H3581 Retinal edema: Secondary | ICD-10-CM | POA: Diagnosis not present

## 2019-01-26 ENCOUNTER — Other Ambulatory Visit: Payer: Self-pay | Admitting: Internal Medicine

## 2019-01-26 DIAGNOSIS — Z1231 Encounter for screening mammogram for malignant neoplasm of breast: Secondary | ICD-10-CM

## 2019-02-28 DIAGNOSIS — H3581 Retinal edema: Secondary | ICD-10-CM | POA: Diagnosis not present

## 2019-02-28 DIAGNOSIS — H30892 Other chorioretinal inflammations, left eye: Secondary | ICD-10-CM | POA: Diagnosis not present

## 2019-02-28 DIAGNOSIS — H44112 Panuveitis, left eye: Secondary | ICD-10-CM | POA: Diagnosis not present

## 2019-03-28 ENCOUNTER — Other Ambulatory Visit: Payer: Self-pay

## 2019-03-28 ENCOUNTER — Ambulatory Visit
Admission: RE | Admit: 2019-03-28 | Discharge: 2019-03-28 | Disposition: A | Discharge status: Home / Self Care | Payer: Medicare HMO | Source: Ambulatory Visit | Attending: Internal Medicine | Admitting: Internal Medicine

## 2019-03-28 DIAGNOSIS — Z1231 Encounter for screening mammogram for malignant neoplasm of breast: Secondary | ICD-10-CM | POA: Insufficient documentation

## 2019-04-11 DIAGNOSIS — H30891 Other chorioretinal inflammations, right eye: Secondary | ICD-10-CM | POA: Diagnosis not present

## 2019-04-11 DIAGNOSIS — H3581 Retinal edema: Secondary | ICD-10-CM | POA: Diagnosis not present

## 2019-05-17 ENCOUNTER — Ambulatory Visit (INDEPENDENT_AMBULATORY_CARE_PROVIDER_SITE_OTHER): Payer: Medicare HMO

## 2019-05-17 ENCOUNTER — Other Ambulatory Visit: Payer: Self-pay

## 2019-05-17 VITALS — BP 118/68 | HR 60 | Temp 98.2°F | Resp 16 | Ht 61.0 in | Wt 133.0 lb

## 2019-05-17 DIAGNOSIS — Z0000 Encounter for general adult medical examination without abnormal findings: Secondary | ICD-10-CM | POA: Diagnosis not present

## 2019-05-17 NOTE — Progress Notes (Signed)
Subjective:   Erika Collins is a 73 y.o. female who presents for Medicare Annual (Subsequent) preventive examination.  Review of Systems:   Cardiac Risk Factors include: advanced age (>65men, >2 women)     Objective:     Vitals: BP 118/68 (BP Location: Left Arm, Patient Position: Sitting, Cuff Size: Normal)   Pulse 60   Temp 98.2 F (36.8 C) (Oral)   Resp 16   Ht 5\' 1"  (1.549 m)   Wt 133 lb (60.3 kg)   SpO2 99%   BMI 25.13 kg/m   Body mass index is 25.13 kg/m.  Advanced Directives 05/17/2019 05/11/2018  Does Patient Have a Medical Advance Directive? Yes No  Type of Paramedic of [place];Living will -  Copy of [place] in Chart? Yes - validated most recent copy scanned in chart (See row information) -  Would patient like information on creating a medical advance directive? - Yes (MAU/Ambulatory/Procedural Areas - Information given)    Tobacco Social History   Tobacco Use  Smoking Status Current Every Day Smoker  . Packs/day: 0.25  . Years: 45.00  . Pack years: 11.25  . Types: Cigarettes  Smokeless Tobacco Never Used  Tobacco Comment   3-4 cigerettes daily     Ready to quit: Not Answered Counseling given: Not Answered Comment: 3-4 cigerettes daily   Clinical Intake:  Pre-visit preparation completed: Yes  Pain : No/denies pain     BMI - recorded: 25.13 Nutritional Status: BMI 25 -29 Overweight Nutritional Risks: None Diabetes: No  How often do you need to have someone help you when you read instructions, pamphlets, or other written materials from your doctor or pharmacy?: 1 - Never  Interpreter Needed?: No  Information entered by :: Clemetine Marker LPN  Past Medical History:  Diagnosis Date  . Arthritis   . Glaucoma   . Osteoarthritis   . Vertebral collapse (HCC)    C3,4,5,6,7 compressed over 50 years ago   Past Surgical History:  Procedure Laterality Date  . COLONOSCOPY  02/24/2011   normal, repeat  10 years  . TUBAL LIGATION     Family History  Problem Relation Age of Onset  . Heart attack Father   . Hyperlipidemia Sister   . Breast cancer Neg Hx    Social History   Socioeconomic History  . Marital status: Widowed    Spouse name: Not on file  . Number of children: 1  . Years of education: some college  . Highest education level: 12th grade  Occupational History  . Occupation: Retired  Scientific laboratory technician  . Financial resource strain: Not hard at all  . Food insecurity    Worry: Never true    Inability: Never true  . Transportation needs    Medical: No    Non-medical: No  Tobacco Use  . Smoking status: Current Every Day Smoker    Packs/day: 0.25    Years: 45.00    Pack years: 11.25    Types: Cigarettes  . Smokeless tobacco: Never Used  . Tobacco comment: 3-4 cigerettes daily  Substance and Sexual Activity  . Alcohol use: Yes    Alcohol/week: 7.0 - 10.0 standard drinks    Types: 7 - 10 Glasses of wine per week  . Drug use: Never  . Sexual activity: Not Currently  Lifestyle  . Physical activity    Days per week: 7 days    Minutes per session: 30 min  . Stress: Only a little  Relationships  . Social Herbalist on phone: Patient refused    Gets together: Patient refused    Attends religious service: Patient refused    Active member of club or organization: Patient refused    Attends meetings of clubs or organizations: Patient refused    Relationship status: Widowed  Other Topics Concern  . Not on file  Social History Narrative  . Not on file    Outpatient Encounter Medications as of 05/17/2019  Medication Sig  . aspirin 81 MG tablet Take 81 mg by mouth daily.   . brimonidine-timolol (COMBIGAN) 0.2-0.5 % ophthalmic solution Place 1 drop into both eyes every 12 (twelve) hours.  . Calcium Carbonate-Vitamin D (CALCIUM 600+D) 600-400 MG-UNIT tablet Take 1 tablet by mouth daily.  . Cholecalciferol (VITAMIN D3) 1000 units CAPS Take by mouth daily.   .  [DISCONTINUED] timolol (TIMOPTIC) 0.5 % ophthalmic solution Place 1 drop into both eyes 2 (two) times daily.    No facility-administered encounter medications on file as of 05/17/2019.     Activities of Daily Living In your present state of health, do you have any difficulty performing the following activities: 05/17/2019  Hearing? N  Comment declines hearing aids  Vision? Y  Difficulty concentrating or making decisions? N  Walking or climbing stairs? N  Dressing or bathing? N  Doing errands, shopping? N  Preparing Food and eating ? N  Using the Toilet? N  In the past six months, have you accidently leaked urine? N  Do you have problems with loss of bowel control? N  Managing your Medications? N  Managing your Finances? N  Housekeeping or managing your Housekeeping? N  Some recent data might be hidden    Patient Care Team: Glean Hess, MD as PCP - General (Internal Medicine) Karren Burly Deirdre Peer, MD as Consulting Physician (Ophthalmology) Dr. Kallie Locks as Consulting Physician ([facility])    Assessment:   This is a routine wellness examination for Erika Collins.  Exercise Activities and Dietary recommendations Current Exercise Habits: Home exercise routine, Type of exercise: walking, Time (Minutes): 30, Frequency (Times/Week): 7, Weekly Exercise (Minutes/Week): 210, Intensity: Moderate, Exercise limited by: None identified  Goals    . DIET - INCREASE WATER INTAKE     Recommend to drink at least 6-8 8oz glasses of water per day.       Fall Risk Fall Risk  05/17/2019 05/17/2018 05/11/2018 12/13/2017  Falls in the past year? 1 No No No  Number falls in past yr: 1 - - -  Injury with Fall? 0 - - -  Risk for fall due to : Impaired vision - Impaired vision;History of fall(s);Medication side effect -  Risk for fall due to: Comment - - wears eyeglasses; tripped over pet; eye gtts cause dizziness -  Follow up Falls prevention discussed - - -   FALL RISK PREVENTION  PERTAINING TO THE HOME:  Any stairs in or around the home? No  If so, do they handrails? No   Home free of loose throw rugs in walkways, pet beds, electrical cords, etc? Yes  Adequate lighting in your home to reduce risk of falls? Yes   ASSISTIVE DEVICES UTILIZED TO PREVENT FALLS:  Life alert? No  Use of a cane, walker or w/c? No  Grab bars in the bathroom? Yes  Shower chair or bench in shower? Yes  Elevated toilet seat or a handicapped toilet? No   DME ORDERS:  DME order needed?  No  TIMED UP AND GO:  Was the test performed? Yes .  Length of time to ambulate 10 feet: 5 sec.   GAIT:  Appearance of gait: Gait stead-fast and without the use of an assistive device.   Education: Fall risk prevention has been discussed.  Intervention(s) required? No    Depression Screen PHQ 2/9 Scores 05/17/2019 05/17/2018 05/11/2018 12/13/2017  PHQ - 2 Score 1 0 0 0  PHQ- 9 Score 2 0 0 -     Cognitive Function     6CIT Screen 05/17/2019 05/11/2018  What Year? 0 points 0 points  What month? 0 points 0 points  What time? 0 points 0 points  Count back from 20 0 points 0 points  Months in reverse 0 points 0 points  Repeat phrase 2 points 0 points  Total Score 2 0    Immunization History  Administered Date(s) Administered  . Pneumococcal Polysaccharide-23 05/17/2018    Qualifies for Shingles Vaccine? Yes  . Due for Shingrix. Education has been provided regarding the importance of this vaccine. Pt has been advised to call insurance company to determine out of pocket expense. Advised may also receive vaccine at local pharmacy or Health Dept. Verbalized acceptance and understanding.  Tdap: Although this vaccine is not a covered service during a Wellness Exam, does the patient still wish to receive this vaccine today?  No .  Education has been provided regarding the importance of this vaccine. Advised may receive this vaccine at local pharmacy or Health Dept. Aware to provide a copy of the  vaccination record if obtained from local pharmacy or Health Dept. Verbalized acceptance and understanding.  Flu Vaccine: Due for Flu vaccine. Does the patient want to receive this vaccine today?  No . Education has been provided regarding the importance of this vaccine but still declined. Advised may receive this vaccine at local pharmacy or Health Dept. Aware to provide a copy of the vaccination record if obtained from local pharmacy or Health Dept. Verbalized acceptance and understanding.  Pneumococcal Vaccine: Due for Pneumococcal vaccine and will get at [facility] on Friday 05/19/19.   Screening Tests Health Maintenance  Topic Date Due  . Hepatitis C Screening  July 20, 1946  . TETANUS/TDAP  10/09/1964  . INFLUENZA VACCINE  04/22/2019  . PNA vac Low Risk Adult (2 of 2 - PCV13) 05/18/2019  . MAMMOGRAM  03/27/2020  . COLONOSCOPY  02/23/2021  . DEXA SCAN  Completed    Cancer Screenings:  Colorectal Screening: Completed 02/24/11. Repeat every 10 years  Mammogram: Completed 03/28/19. Repeat every year  Bone Density: Completed 06/07/18. Results reflect  OSTEOPENIA. Repeat every 2 years.   Lung Cancer Screening: (Low Dose CT Chest recommended if Age 17-80 years, 30 pack-year currently smoking OR have quit w/in 15years.) does not qualify.   Additional Screening:  Hepatitis C Screening: does qualify; postponed  Vision Screening: Recommended annual ophthalmology exams for early detection of glaucoma and other disorders of the eye. Is the patient up to date with their annual eye exam?  Yes  Who is the provider or what is the name of the office in which the pt attends annual eye exams? Dr. Cephus Shelling .  Dental Screening: Recommended annual dental exams for proper oral hygiene  Community Resource Referral:  CRR required this visit?  No      Plan:     I have personally reviewed and addressed the Medicare Annual Wellness questionnaire and have noted the following in the patient's chart:  A. Medical and  social history B. Use of alcohol, tobacco or illicit drugs  C. Current medications and supplements D. Functional ability and status E.  Nutritional status F.  Physical activity G. Advance directives H. List of other physicians I.  Hospitalizations, surgeries, and ER visits in previous 12 months J.  [place] such as hearing and vision if needed, cognitive and depression L. Referrals and appointments   In addition, I have reviewed and discussed with patient certain preventive protocols, quality metrics, and best practice recommendations. A written personalized care plan for preventive services as well as general preventive health recommendations were provided to patient.   Signed,  Clemetine Marker, LPN Nurse Health Advisor   Nurse Notes: pt c/o pain in left hand related to arthritis that is more stiffness and discomfort. Also c/o itching on bottom of feet at night and slight numbness of bottom of left foot. Pt plans to discuss at [facility] on Friday.

## 2019-05-17 NOTE — Patient Instructions (Signed)
Erika Collins , Thank you for taking time to come for your Medicare Wellness Visit. I appreciate your ongoing commitment to your health goals. Please review the following plan we discussed and let me know if I can assist you in the future.   Screening recommendations/referrals: Colonoscopy: 02/24/11  Mammogram: done 03/28/19 Bone Density: done 06/07/18 Recommended yearly ophthalmology/optometry visit for glaucoma screening and checkup Recommended yearly dental visit for hygiene and checkup  Vaccinations: Influenza vaccine: postponed Pneumococcal vaccine: done 05/17/18 due for second dose Tdap vaccine: please contact us if you get a cut or scrape on rust or metal Shingles vaccine: Shingrix discussed. Please contact your pharmacy for coverage information.   Conditions/risks identified: Keep up the great work!  Next appointment: Please follow up in one year for your Medicare Annual Wellness visit.     Preventive Care 7 Years and Older, Female Preventive care refers to lifestyle choices and visits with your health care provider that can promote health and wellness. What does preventive care include?  A yearly physical exam. This is also called an annual well check.  Dental exams once or twice a year.  Routine eye exams. Ask your health care provider how often you should have your eyes checked.  Personal lifestyle choices, including:  Daily care of your teeth and gums.  Regular physical activity.  Eating a healthy diet.  Avoiding tobacco and drug use.  Limiting alcohol use.  Practicing safe sex.  Taking low-dose aspirin every day.  Taking vitamin and mineral supplements as recommended by your health care provider. What happens during an annual well check? The services and screenings done by your health care provider during your annual well check will depend on your age, overall health, lifestyle risk factors, and family history of disease. Counseling  Your health care provider  may ask you questions about your:  Alcohol use.  Tobacco use.  Drug use.  Emotional well-being.  Home and relationship well-being.  Sexual activity.  Eating habits.  History of falls.  Memory and ability to understand (cognition).  Work and work Statistician.  Reproductive health. Screening  You may have the following tests or measurements:  Height, weight, and BMI.  Blood pressure.  Lipid and cholesterol levels. These may be checked every 5 years, or more frequently if you are over 75 years old.  Skin check.  Lung cancer screening. You may have this screening every year starting at age 38 if you have a 30-pack-year history of smoking and currently smoke or have quit within the past 15 years.  Fecal occult blood test (FOBT) of the stool. You may have this test every year starting at age 65.  Flexible sigmoidoscopy or colonoscopy. You may have a sigmoidoscopy every 5 years or a colonoscopy every 10 years starting at age 40.  Hepatitis C blood test.  Hepatitis B blood test.  Sexually transmitted disease (STD) testing.  Diabetes screening. This is done by checking your blood sugar (glucose) after you have not eaten for a while (fasting). You may have this done every 1-3 years.  Bone density scan. This is done to screen for osteoporosis. You may have this done starting at age 42.  Mammogram. This may be done every 1-2 years. Talk to your health care provider about how often you should have regular mammograms. Talk with your health care provider about your test results, treatment options, and if necessary, the need for more tests. Vaccines  Your health care provider may recommend certain vaccines, such as:  Influenza  vaccine. This is recommended every year.  Tetanus, diphtheria, and acellular pertussis (Tdap, Td) vaccine. You may need a Td booster every 10 years.  Zoster vaccine. You may need this after age 25.  Pneumococcal 13-valent conjugate (PCV13) vaccine.  One dose is recommended after age 76.  Pneumococcal polysaccharide (PPSV23) vaccine. One dose is recommended after age 43. Talk to your health care provider about which screenings and vaccines you need and how often you need them. This information is not intended to replace advice given to you by your health care provider. Make sure you discuss any questions you have with your health care provider. Document Released: 10/04/2015 Document Revised: 05/27/2016 Document Reviewed: 07/09/2015 Elsevier Interactive Patient Education  2017 [place] Prevention in the Home Falls can cause injuries. They can happen to people of all ages. There are many things you can do to make your home safe and to help prevent falls. What can I do on the outside of my home?  Regularly fix the edges of walkways and driveways and fix any cracks.  Remove anything that might make you trip as you walk through a door, such as a raised step or threshold.  Trim any bushes or trees on the path to your home.  Use bright outdoor lighting.  Clear any walking paths of anything that might make someone trip, such as rocks or tools.  Regularly check to see if handrails are loose or broken. Make sure that both sides of any steps have handrails.  Any raised decks and porches should have guardrails on the edges.  Have any leaves, snow, or ice cleared regularly.  Use sand or salt on walking paths during winter.  Clean up any spills in your garage right away. This includes oil or grease spills. What can I do in the bathroom?  Use night lights.  Install grab bars by the toilet and in the tub and shower. Do not use towel bars as grab bars.  Use non-skid mats or decals in the tub or shower.  If you need to sit down in the shower, use a plastic, non-slip stool.  Keep the floor dry. Clean up any water that spills on the floor as soon as it happens.  Remove soap buildup in the tub or shower regularly.  Attach bath  mats securely with double-sided non-slip rug tape.  Do not have throw rugs and other things on the floor that can make you trip. What can I do in the bedroom?  Use night lights.  Make sure that you have a light by your bed that is easy to reach.  Do not use any sheets or blankets that are too big for your bed. They should not hang down onto the floor.  Have a firm chair that has side arms. You can use this for support while you get dressed.  Do not have throw rugs and other things on the floor that can make you trip. What can I do in the kitchen?  Clean up any spills right away.  Avoid walking on wet floors.  Keep items that you use a lot in easy-to-reach places.  If you need to reach something above you, use a strong step stool that has a grab bar.  Keep electrical cords out of the way.  Do not use floor polish or wax that makes floors slippery. If you must use wax, use non-skid floor wax.  Do not have throw rugs and other things on the floor that can  make you trip. What can I do with my stairs?  Do not leave any items on the stairs.  Make sure that there are handrails on both sides of the stairs and use them. Fix handrails that are broken or loose. Make sure that handrails are as long as the stairways.  Check any carpeting to make sure that it is firmly attached to the stairs. Fix any carpet that is loose or worn.  Avoid having throw rugs at the top or bottom of the stairs. If you do have throw rugs, attach them to the floor with carpet tape.  Make sure that you have a light switch at the top of the stairs and the bottom of the stairs. If you do not have them, ask someone to add them for you. What else can I do to help prevent falls?  Wear shoes that:  Do not have high heels.  Have rubber bottoms.  Are comfortable and fit you well.  Are closed at the toe. Do not wear sandals.  If you use a stepladder:  Make sure that it is fully opened. Do not climb a closed  stepladder.  Make sure that both sides of the stepladder are locked into place.  Ask someone to hold it for you, if possible.  Clearly mark and make sure that you can see:  Any grab bars or handrails.  First and last steps.  Where the edge of each step is.  Use tools that help you move around (mobility aids) if they are needed. These include:  Canes.  Walkers.  Scooters.  Crutches.  Turn on the lights when you go into a dark area. Replace any light bulbs as soon as they burn out.  Set up your furniture so you have a clear path. Avoid moving your furniture around.  If any of your floors are uneven, fix them.  If there are any pets around you, be aware of where they are.  Review your medicines with your doctor. Some medicines can make you feel dizzy. This can increase your chance of falling. Ask your doctor what other things that you can do to help prevent falls. This information is not intended to replace advice given to you by your health care provider. Make sure you discuss any questions you have with your health care provider. Document Released: 07/04/2009 Document Revised: 02/13/2016 Document Reviewed: 10/12/2014 Elsevier Interactive Patient Education  2017 [employer].

## 2019-05-19 ENCOUNTER — Other Ambulatory Visit: Payer: Self-pay

## 2019-05-19 ENCOUNTER — Ambulatory Visit (INDEPENDENT_AMBULATORY_CARE_PROVIDER_SITE_OTHER): Payer: Medicare HMO | Admitting: Internal Medicine

## 2019-05-19 ENCOUNTER — Encounter: Payer: Self-pay | Admitting: Internal Medicine

## 2019-05-19 VITALS — BP 112/84 | HR 71 | Ht 61.0 in | Wt 133.0 lb

## 2019-05-19 DIAGNOSIS — Z0000 Encounter for general adult medical examination without abnormal findings: Secondary | ICD-10-CM | POA: Diagnosis not present

## 2019-05-19 DIAGNOSIS — E559 Vitamin D deficiency, unspecified: Secondary | ICD-10-CM | POA: Diagnosis not present

## 2019-05-19 DIAGNOSIS — M19049 Primary osteoarthritis, unspecified hand: Secondary | ICD-10-CM | POA: Diagnosis not present

## 2019-05-19 DIAGNOSIS — R200 Anesthesia of skin: Secondary | ICD-10-CM | POA: Diagnosis not present

## 2019-05-19 DIAGNOSIS — Z23 Encounter for immunization: Secondary | ICD-10-CM | POA: Diagnosis not present

## 2019-05-19 DIAGNOSIS — Z1231 Encounter for screening mammogram for malignant neoplasm of breast: Secondary | ICD-10-CM | POA: Diagnosis not present

## 2019-05-19 DIAGNOSIS — R202 Paresthesia of skin: Secondary | ICD-10-CM | POA: Diagnosis not present

## 2019-05-19 DIAGNOSIS — S40022A Contusion of left upper arm, initial encounter: Secondary | ICD-10-CM | POA: Diagnosis not present

## 2019-05-19 DIAGNOSIS — E785 Hyperlipidemia, unspecified: Secondary | ICD-10-CM | POA: Diagnosis not present

## 2019-05-19 LAB — POCT URINALYSIS DIPSTICK
Bilirubin, UA: NEGATIVE
Blood, UA: NEGATIVE
Glucose, UA: NEGATIVE
Ketones, UA: NEGATIVE
Leukocytes, UA: NEGATIVE
Nitrite, UA: NEGATIVE
Protein, UA: NEGATIVE
Spec Grav, UA: 1.01 (ref 1.010–1.025)
Urobilinogen, UA: 0.2 E.U./dL
pH, UA: 6 (ref 5.0–8.0)

## 2019-05-19 NOTE — Progress Notes (Signed)
Date:  05/19/2019   Name:  Erika Collins   DOB:  15-Oct-1945   MRN:  [id number]   Chief Complaint: Annual Exam (Breast Exam. Just had Maw 2 days ago.) Erika Collins is a 73 y.o. female who presents today for her Complete Annual Exam. She feels fairly well. She reports exercising regularly stretching and floor exercise, some cardio. She reports she is sleeping well. She denies breast complaints.  Colonoscopy  02/2011 Mammogram 03/2019 Immunizations   Hyperlipidemia This is a chronic problem. The problem is uncontrolled. Associated symptoms include myalgias. Pertinent negatives include no chest pain or shortness of breath. She is currently on no antihyperlipidemic treatment (I suggested starting meds last year ).  Vitamin D def - levels were low last year and patient was to increase her supplement.  She doubled it to 1000 IU daily. Arm Pain - she noticed pain and bruising in her left arm two days ago after having her BP taken.  It is slightly tender and warm.  She has no chest pain, SOB, fever.  She has not taken anything to help.  She also has cervical HNP and intermittent tingling in her left hand. Numbness in left foot - she has long standing intermittent tingling in the toes and sole of her left foot.  It is not related to activity.  It does not cause any pain,  There is no hx of trauma and there is no swelling.  Review of Systems  Constitutional: Negative for chills, fatigue and fever.  HENT: Negative for congestion, hearing loss, tinnitus, trouble swallowing and voice change.   Eyes: Negative for visual disturbance.  Respiratory: Negative for cough, chest tightness, shortness of breath and wheezing.   Cardiovascular: Negative for chest pain, palpitations and leg swelling.  Gastrointestinal: Negative for abdominal pain, constipation, diarrhea and vomiting.  Endocrine: Negative for polydipsia and polyuria.  Genitourinary: Negative for dysuria, frequency, genital sores, vaginal bleeding and  vaginal discharge.  Musculoskeletal: Positive for myalgias. Negative for arthralgias (hands), gait problem and joint swelling.  Skin: Negative for color change and rash.  Neurological: Negative for dizziness, tremors, light-headedness and headaches. Numbness: of left foot intermittently and left hand intermittently.  Hematological: Negative for adenopathy. Does not bruise/bleed easily.  Psychiatric/Behavioral: Negative for dysphoric mood and sleep disturbance. The patient is not nervous/anxious.     Patient Active Problem List   Diagnosis Date Noted  . Cervical disc disorder of mid-cervical region 05/17/2018  . History of TIA (transient ischemic attack) 05/17/2018  . Vitamin D deficiency 05/17/2018  . 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  . Mild hyperlipidemia 12/13/2017  . Tobacco use 12/13/2017  . Primary open angle glaucoma 01/13/2013    No Known Allergies  Past Surgical History:  Procedure Laterality Date  . COLONOSCOPY  02/24/2011   normal, repeat 10 years  . TUBAL LIGATION      Social History   Tobacco Use  . Smoking status: Current Every Day Smoker    Packs/day: 0.25    Years: 45.00    Pack years: 11.25    Types: Cigarettes  . Smokeless tobacco: Never Used  . Tobacco comment: 3-4 cigerettes daily  Substance Use Topics  . Alcohol use: Yes    Alcohol/week: 7.0 - 10.0 standard drinks    Types: 7 - 10 Glasses of wine per week  . Drug use: Never     Medication list has been reviewed and updated.  Current Meds  Medication Sig  . aspirin 81 MG tablet Take 81  mg by mouth daily.   . brimonidine-timolol (COMBIGAN) 0.2-0.5 % ophthalmic solution Place 1 drop into both eyes every 12 (twelve) hours.  . Calcium Carbonate-Vitamin D (CALCIUM 600+D) 600-400 MG-UNIT tablet Take 1 tablet by mouth daily.  . Cholecalciferol (VITAMIN D3) 1000 units CAPS Take by mouth daily.     PHQ 2/9 Scores 05/17/2019 05/17/2018 05/11/2018 12/13/2017  PHQ - 2 Score 1 0 0 0  PHQ- 9 Score 2 0 0 -     BP Readings from Last 3 Encounters:  05/19/19 112/84  05/17/19 118/68  05/17/18 118/78    Physical Exam Vitals signs and nursing note reviewed.  Constitutional:      General: She is not in acute distress.    Appearance: She is well-developed.  HENT:     Head: Normocephalic and atraumatic.     Right Ear: Tympanic membrane and ear canal normal.     Left Ear: Tympanic membrane and ear canal normal.     Nose:     Right Sinus: No maxillary sinus tenderness.     Left Sinus: No maxillary sinus tenderness.  Eyes:     General: No scleral icterus.       Right eye: No discharge.        Left eye: No discharge.     Conjunctiva/sclera: Conjunctivae normal.  Neck:     Musculoskeletal: Normal range of motion. No erythema.     Thyroid: No thyromegaly.     Vascular: No carotid bruit.  Cardiovascular:     Rate and Rhythm: Normal rate and regular rhythm.     Pulses: Normal pulses.     Heart sounds: Normal heart sounds.  Pulmonary:     Effort: Pulmonary effort is normal. No respiratory distress.     Breath sounds: No wheezing.  Chest:     Breasts:        Right: Inverted nipple present. No mass, nipple discharge, skin change or tenderness.        Left: No mass, nipple discharge, skin change or tenderness.  Abdominal:     General: Bowel sounds are normal.     Palpations: Abdomen is soft.     Tenderness: There is no abdominal tenderness.  Musculoskeletal: Normal range of motion.     Right lower leg: No edema.     Left lower leg: No edema.     Comments: Left inner arm with mild swelling and bruising from the upper arm down to the wrist.  No cord appreciated.  Mildly warm.  Full range of motion.  OA changes of all fingers on both hands Mild thenar atrophy of right hand  Lymphadenopathy:     Cervical: No cervical adenopathy.  Skin:    General: Skin is warm and dry.     Findings: No rash.  Neurological:     Mental Status: She is alert and oriented to person, place, and time.      Cranial Nerves: No cranial nerve deficit.     Sensory: Sensation is intact. No sensory deficit.     Motor: Motor function is intact.     Coordination: Coordination is intact.     Deep Tendon Reflexes: Reflexes are normal and symmetric.     Reflex Scores:      Patellar reflexes are 2+ on the right side and 2+ on the left side. Psychiatric:        Attention and Perception: Attention normal.        Mood and Affect: Mood normal.  Speech: Speech normal.        Behavior: Behavior normal.        Thought Content: Thought content normal.     Wt Readings from Last 3 Encounters:  05/19/19 133 lb (60.3 kg)  05/17/19 133 lb (60.3 kg)  05/17/18 128 lb (58.1 kg)    BP 112/84   Pulse 71   Ht 5\' 1"  (1.549 m)   Wt 133 lb (60.3 kg)   SpO2 97%   BMI 25.13 kg/m   Assessment and Plan: 1. Annual physical exam Normal exam; continue healthy diet and exercise - CBC with Differential/Platelet - TSH - POCT urinalysis dipstick  2. Encounter for screening mammogram for breast cancer Recently completed  3. Mild hyperlipidemia Pt does not want to take statin medication She will continue low fat diet, exercise, weight control - Comprehensive metabolic panel - Lipid panel  4. Vitamin D deficiency Supplemented; check labs - VITAMIN D 25 Hydroxy (Vit-D Deficiency, Fractures)  5. Numbness and tingling of foot - Vitamin B12  6. Need for vaccination for pneumococcus - Pneumococcal conjugate vaccine 13-valent IM  7. Superficial bruising of arm, left, initial encounter Suspect this if from rupture of small vein during measurement of BP earlier this week Pt is reassured.  No evidence of DVT and do not suspect infections Take tylenol, elevate and use heat - call if not improving after 5 days  8. OA (osteoarthritis) of finger, unspecified laterality Recommend topical joint pain rubs as needed   Partially dictated using Editor, commissioning. Any errors are unintentional.  Halina Maidens, MD  [facility]  05/19/2019

## 2019-05-20 LAB — CBC WITH DIFFERENTIAL/PLATELET
Basophils Absolute: 0 10*3/uL (ref 0.0–0.2)
Basos: 1 %
EOS (ABSOLUTE): 0.3 10*3/uL (ref 0.0–0.4)
Eos: 5 %
Hematocrit: 39.2 % (ref 34.0–46.6)
Hemoglobin: 13.3 g/dL (ref 11.1–15.9)
Immature Grans (Abs): 0 10*3/uL (ref 0.0–0.1)
Immature Granulocytes: 0 %
Lymphocytes Absolute: 1 10*3/uL (ref 0.7–3.1)
Lymphs: 19 %
MCH: 32.6 pg (ref 26.6–33.0)
MCHC: 33.9 g/dL (ref 31.5–35.7)
MCV: 96 fL (ref 79–97)
Monocytes Absolute: 0.5 10*3/uL (ref 0.1–0.9)
Monocytes: 9 %
Neutrophils Absolute: 3.6 10*3/uL (ref 1.4–7.0)
Neutrophils: 66 %
Platelets: 155 10*3/uL (ref 150–450)
RBC: 4.08 x10E6/uL (ref 3.77–5.28)
RDW: 12.2 % (ref 11.7–15.4)
WBC: 5.4 10*3/uL (ref 3.4–10.8)

## 2019-05-20 LAB — COMPREHENSIVE METABOLIC PANEL
ALT: 24 IU/L (ref 0–32)
AST: 24 IU/L (ref 0–40)
Albumin/Globulin Ratio: 1.4 (ref 1.2–2.2)
Albumin: 4.4 g/dL (ref 3.7–4.7)
Alkaline Phosphatase: 67 IU/L (ref 39–117)
BUN/Creatinine Ratio: 16 (ref 12–28)
BUN: 13 mg/dL (ref 8–27)
Bilirubin Total: 0.5 mg/dL (ref 0.0–1.2)
CO2: 24 mmol/L (ref 20–29)
Calcium: 10 mg/dL (ref 8.7–10.3)
Chloride: 103 mmol/L (ref 96–106)
Creatinine, Ser: 0.79 mg/dL (ref 0.57–1.00)
GFR calc Af Amer: 86 mL/min/{1.73_m2} (ref >59)
GFR calc non Af Amer: 74 mL/min/{1.73_m2} (ref >59)
Globulin, Total: 3.1 g/dL (ref 1.5–4.5)
Glucose: 109 mg/dL — ABNORMAL HIGH (ref 65–99)
Potassium: 4.3 mmol/L (ref 3.5–5.2)
Sodium: 140 mmol/L (ref 134–144)
Total Protein: 7.5 g/dL (ref 6.0–8.5)

## 2019-05-20 LAB — VITAMIN D 25 HYDROXY (VIT D DEFICIENCY, FRACTURES): Vit D, 25-Hydroxy: 33.6 ng/mL (ref 30.0–100.0)

## 2019-05-20 LAB — TSH: TSH: 1.16 u[IU]/mL (ref 0.450–4.500)

## 2019-05-20 LAB — LIPID PANEL
Chol/HDL Ratio: 4.4 ratio (ref 0.0–4.4)
Cholesterol, Total: 217 mg/dL — ABNORMAL HIGH (ref 100–199)
HDL: 49 mg/dL (ref >39)
LDL Calculated: 144 mg/dL — ABNORMAL HIGH (ref 0–99)
Triglycerides: 122 mg/dL (ref 0–149)
VLDL Cholesterol Cal: 24 mg/dL (ref 5–40)

## 2019-05-20 LAB — VITAMIN B12: Vitamin B-12: 355 pg/mL (ref 232–1245)

## 2019-05-23 DIAGNOSIS — H44113 Panuveitis, bilateral: Secondary | ICD-10-CM | POA: Diagnosis not present

## 2019-05-23 DIAGNOSIS — H30893 Other chorioretinal inflammations, bilateral: Secondary | ICD-10-CM | POA: Diagnosis not present

## 2019-07-07 DIAGNOSIS — H30891 Other chorioretinal inflammations, right eye: Secondary | ICD-10-CM | POA: Diagnosis not present

## 2019-07-07 DIAGNOSIS — H3581 Retinal edema: Secondary | ICD-10-CM | POA: Diagnosis not present

## 2019-08-29 DIAGNOSIS — H44112 Panuveitis, left eye: Secondary | ICD-10-CM | POA: Diagnosis not present

## 2019-08-29 DIAGNOSIS — H30892 Other chorioretinal inflammations, left eye: Secondary | ICD-10-CM | POA: Diagnosis not present

## 2019-08-29 DIAGNOSIS — H3581 Retinal edema: Secondary | ICD-10-CM | POA: Diagnosis not present

## 2019-09-19 DIAGNOSIS — H30893 Other chorioretinal inflammations, bilateral: Secondary | ICD-10-CM | POA: Diagnosis not present

## 2019-09-19 DIAGNOSIS — H3581 Retinal edema: Secondary | ICD-10-CM | POA: Diagnosis not present

## 2019-10-09 ENCOUNTER — Telehealth: Payer: Self-pay

## 2019-10-09 NOTE — Telephone Encounter (Signed)
Last few weeks the tingling and numbness in her feet are getting worse. Left leg is in pain from hip down to knee area. Still having tingling in left hand and left foot.   Getting gradually worse. She knows she does have compressed disc's in her back.   Losing feeling in her left hand.Wants to know if you recommend a specialist for this?  Neurology?

## 2019-10-09 NOTE — Telephone Encounter (Signed)
Leg and feet sx are likely from her back issues.  She should see ortho for that.  The numbness in the hand is most likely carpal tunnel.  I can see her first and then decide for she can see an Ortho Hand specialist.

## 2019-10-09 NOTE — Telephone Encounter (Signed)
Pt scheduled to come in Wednesday to discuss and get referrals.

## 2019-10-11 ENCOUNTER — Ambulatory Visit (INDEPENDENT_AMBULATORY_CARE_PROVIDER_SITE_OTHER): Payer: Medicare HMO | Admitting: Internal Medicine

## 2019-10-11 ENCOUNTER — Encounter: Payer: Self-pay | Admitting: Internal Medicine

## 2019-10-11 ENCOUNTER — Other Ambulatory Visit: Payer: Self-pay

## 2019-10-11 VITALS — BP 114/78 | HR 69 | Temp 97.6°F | Ht 61.0 in | Wt 135.0 lb

## 2019-10-11 DIAGNOSIS — M5442 Lumbago with sciatica, left side: Secondary | ICD-10-CM

## 2019-10-11 DIAGNOSIS — G8929 Other chronic pain: Secondary | ICD-10-CM

## 2019-10-11 DIAGNOSIS — M5010 Cervical disc disorder with radiculopathy, unspecified cervical region: Secondary | ICD-10-CM | POA: Diagnosis not present

## 2019-10-11 MED ORDER — GABAPENTIN 100 MG PO CAPS: 100.0000 mg | ORAL_CAPSULE | Freq: Every day | ORAL | 0 refills | Status: DC

## 2019-10-11 NOTE — Progress Notes (Signed)
Date:  10/11/2019   Name:  Erika Collins   DOB:  [birth year]   MRN:  [id number]   Chief Complaint: Numbness (Numbness and tingling in left hand. Left Thumb pain. ) and Back Pain (Wants referral for orthopedics for compressed disc's. Left leg discomfort and numbness in left foot. )  Hand Pain  There was no injury mechanism. The pain is present in the left hand (thumb). The quality of the pain is described as aching (with tingling and numbness). Associated symptoms include muscle weakness, numbness (in left hand) and tingling. Pertinent negatives include no chest pain. Associated symptoms comments: She has known cervical disk disease dating back many years.  . Exacerbated by: known cervical disc disease.  Leg Pain  There was no injury mechanism. Pain location: itching at night in her left foot, mild sciatic pain down left outer leg at times. Associated symptoms include muscle weakness, numbness (in left hand) and tingling.    Lab Results  Component Value Date   CREATININE 0.79 05/19/2019   BUN 13 05/19/2019   NA 140 05/19/2019   K 4.3 05/19/2019   CL 103 05/19/2019   CO2 24 05/19/2019   Lab Results  Component Value Date   CHOL 217 (H) 05/19/2019   HDL 49 05/19/2019   LDLCALC 144 (H) 05/19/2019   TRIG 122 05/19/2019   CHOLHDL 4.4 05/19/2019   Lab Results  Component Value Date   TSH 1.160 05/19/2019   No results found for: HGBA1C   Review of Systems  Constitutional: Negative for chills, diaphoresis and fever.  Respiratory: Negative for cough, chest tightness and shortness of breath.   Cardiovascular: Negative for chest pain, palpitations and leg swelling.  Musculoskeletal: Positive for back pain and neck pain. Negative for joint swelling.  Neurological: Positive for tingling, weakness (some grip weakness in left hand) and numbness (in left hand). Negative for dizziness, tremors, light-headedness and headaches.    Patient Active Problem List   Diagnosis Date Noted  . OA  (osteoarthritis) of finger, unspecified laterality 05/19/2019  . Numbness and tingling of foot 05/19/2019  . Cervical disc disorder of mid-cervical region 05/17/2018  . History of TIA (transient ischemic attack) 05/17/2018  . Vitamin D deficiency 05/17/2018  . 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  . Mild hyperlipidemia 12/13/2017  . Tobacco use 12/13/2017  . Primary open angle glaucoma 01/13/2013    No Known Allergies  Past Surgical History:  Procedure Laterality Date  . COLONOSCOPY  02/24/2011   normal, repeat 10 years  . TUBAL LIGATION      Social History   Tobacco Use  . Smoking status: Current Every Day Smoker    Packs/day: 0.25    Years: 45.00    Pack years: 11.25    Types: Cigarettes  . Smokeless tobacco: Never Used  . Tobacco comment: 3-4 cigerettes daily  Substance Use Topics  . Alcohol use: Yes    Alcohol/week: 7.0 - 10.0 standard drinks    Types: 7 - 10 Glasses of wine per week  . Drug use: Never     Medication list has been reviewed and updated.  Current Meds  Medication Sig  . Ascorbic Acid (VITAMIN C) 1000 MG tablet Take 1,000 mg by mouth daily.  Marland Kitchen aspirin 81 MG tablet Take 81 mg by mouth daily.   . brimonidine-timolol (COMBIGAN) 0.2-0.5 % ophthalmic solution Place 1 drop into both eyes every 12 (twelve) hours.  . Calcium Carbonate-Vitamin D (CALCIUM 600+D) 600-400 MG-UNIT tablet Take 1 tablet by  mouth daily.    PHQ 2/9 Scores 10/11/2019 05/17/2019 05/17/2018 05/11/2018  PHQ - 2 Score 0 1 0 0  PHQ- 9 Score - 2 0 0    BP Readings from Last 3 Encounters:  10/11/19 114/78  05/19/19 112/84  05/17/19 118/68    Physical Exam Vitals and nursing note reviewed.  Constitutional:      General: She is not in acute distress.    Appearance: Normal appearance. She is well-developed.  HENT:     Head: Normocephalic and atraumatic.  Cardiovascular:     Rate and Rhythm: Normal rate and regular rhythm.     Pulses:          Dorsalis pedis pulses are 2+ on the  right side and 2+ on the left side.       Posterior tibial pulses are 1+ on the right side and 1+ on the left side.  Pulmonary:     Effort: Pulmonary effort is normal. No respiratory distress.     Breath sounds: No wheezing or rhonchi.  Abdominal:     General: Abdomen is flat.     Palpations: Abdomen is soft.  Musculoskeletal:     Cervical back: No tenderness or bony tenderness. Decreased range of motion.     Lumbar back: Decreased range of motion. Positive left straight leg raise test. Negative right straight leg raise test.     Right hip: Normal range of motion. Normal strength.     Left hip: Decreased range of motion. Normal strength.     Right lower leg: No edema.     Left lower leg: No edema.  Lymphadenopathy:     Cervical: No cervical adenopathy.  Skin:    General: Skin is warm and dry.     Findings: No rash.  Neurological:     Mental Status: She is alert and oriented to person, place, and time.     Motor: Weakness (pinching strength is decreased on the left) present. No tremor or abnormal muscle tone.     Gait: Gait is intact.     Deep Tendon Reflexes:     Reflex Scores:      Bicep reflexes are 1+ on the right side and 2+ on the left side.      Patellar reflexes are 2+ on the right side and 2+ on the left side. Psychiatric:        Behavior: Behavior normal.        Thought Content: Thought content normal.     Wt Readings from Last 3 Encounters:  10/11/19 135 lb (61.2 kg)  05/19/19 133 lb (60.3 kg)  05/17/19 133 lb (60.3 kg)    BP 114/78   Pulse 69   Temp 97.6 F (36.4 C) (Oral)   Ht 5\' 1"  (1.549 m)   Wt 135 lb (61.2 kg)   SpO2 98%   BMI 25.51 kg/m   Assessment and Plan: 1. Cervical disc disorder with radiculopathy Now having more left UE symptoms of numbness and weakness She will continue her home exercise program and I will refer - Ambulatory referral to Neurosurgery  2. Chronic bilateral low back pain with left-sided sciatica Itching in left foot is  likely neuropathic  Will try low dose Neurontin at bedtime - gabapentin (NEURONTIN) 100 MG capsule; Take 1-3 capsules (100-300 mg total) by mouth at bedtime.  Dispense: 90 capsule; Refill: 0   Partially dictated using Editor, commissioning. Any errors are unintentional.  Halina Maidens, MD [facility]  10/11/2019

## 2019-10-11 NOTE — Patient Instructions (Signed)
Covid Vaccine locations: [employer] Dept. [place] [email or web address]

## 2019-10-16 ENCOUNTER — Encounter: Payer: Self-pay | Admitting: Internal Medicine

## 2019-11-14 DIAGNOSIS — H30893 Other chorioretinal inflammations, bilateral: Secondary | ICD-10-CM | POA: Diagnosis not present

## 2019-11-14 DIAGNOSIS — H3581 Retinal edema: Secondary | ICD-10-CM | POA: Diagnosis not present

## 2019-11-14 IMAGING — MG DIGITAL SCREENING BILATERAL MAMMOGRAM WITH TOMO AND CAD
8 series · 8 of 24 positions shown · non-contrast
Comparison: Previous exam(s).

CLINICAL DATA: Screening.

EXAM:
DIGITAL SCREENING BILATERAL MAMMOGRAM WITH TOMO AND CAD

[R MLO synth-2D]
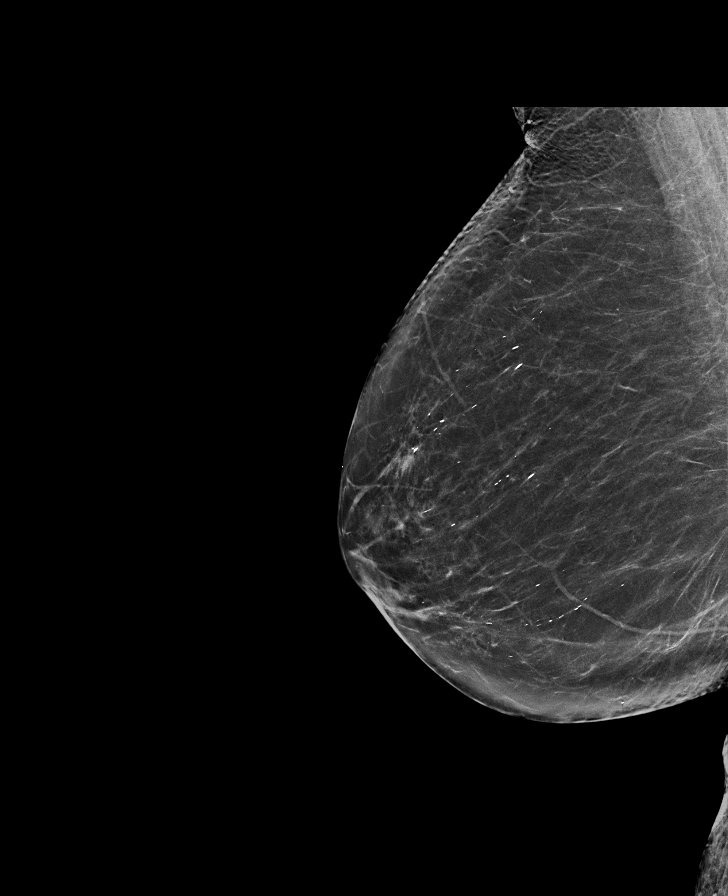

[R CC synth-2D]
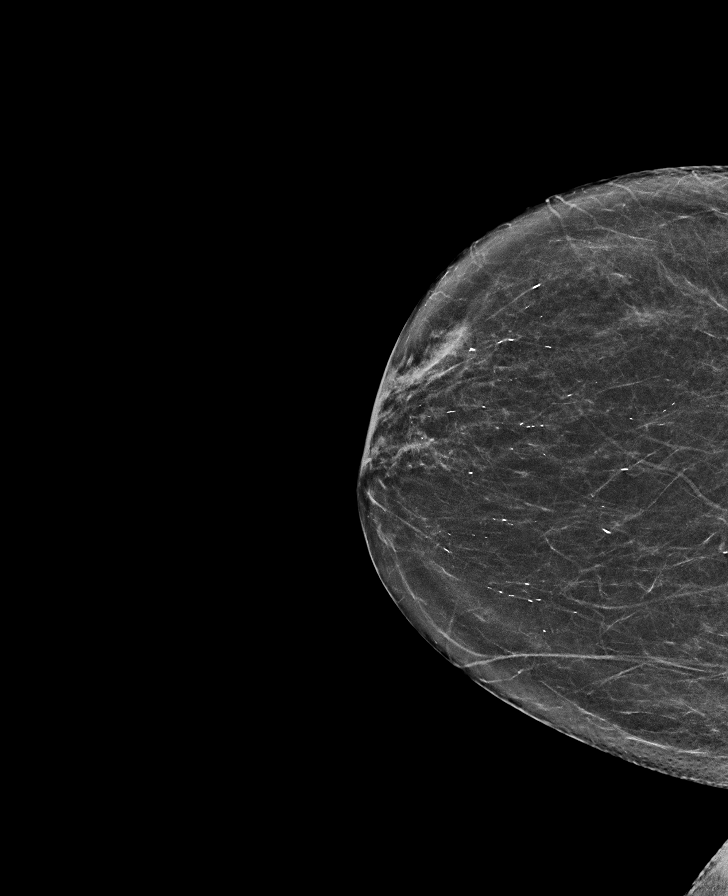

[L CC synth-2D]
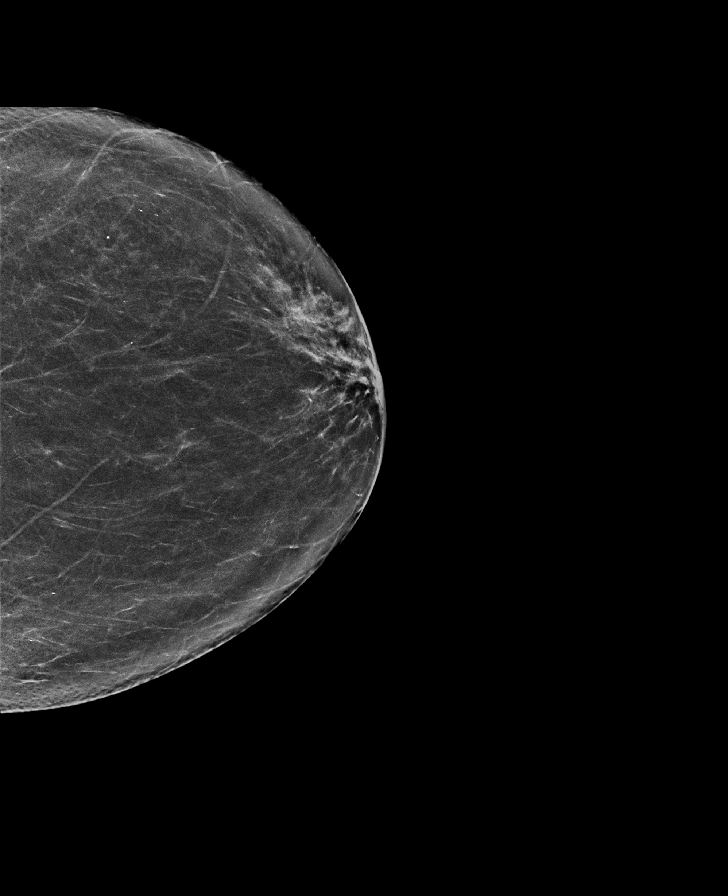

[L MLO synth-2D]
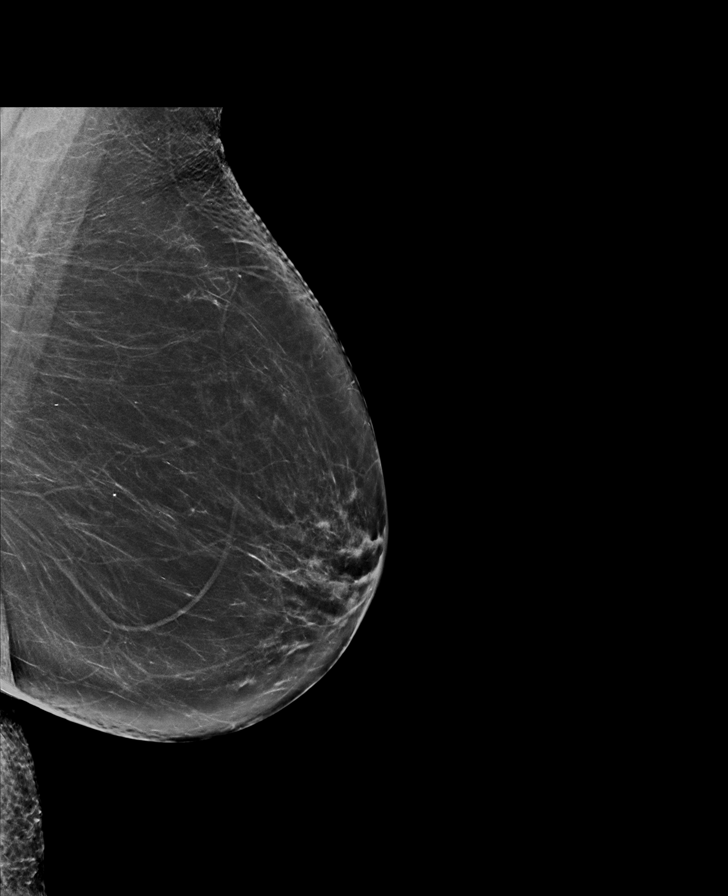

[R MLO tomo · tomo slice 35/69.0]
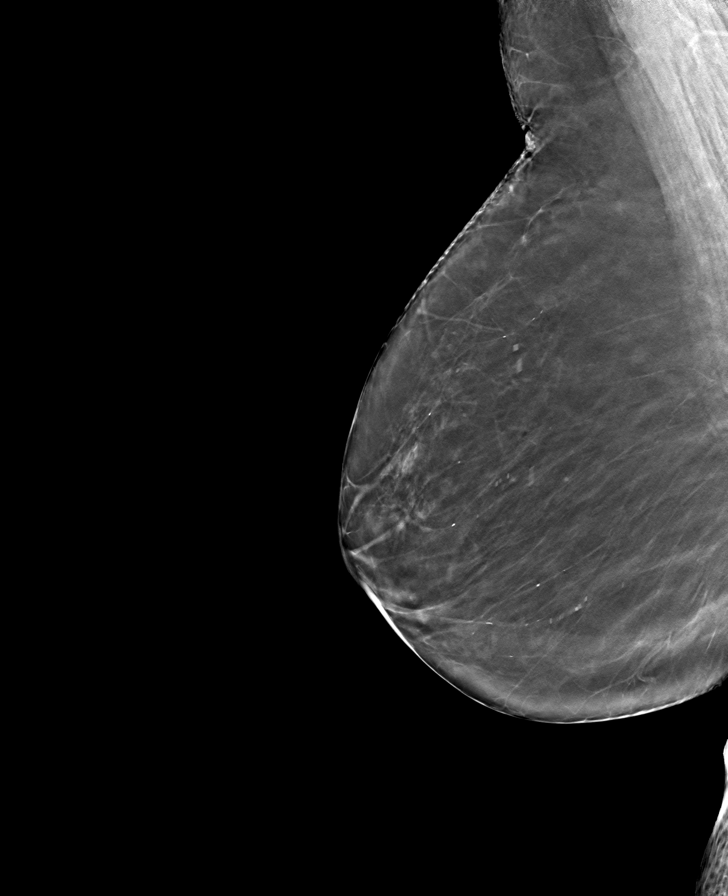

[R CC tomo · tomo slice 31/60.0]
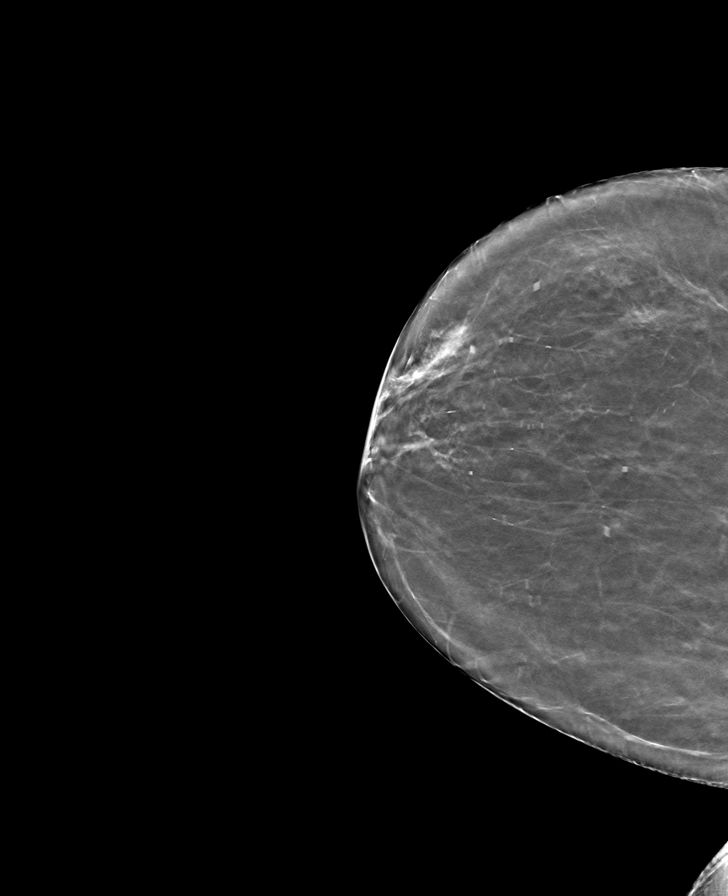

[L CC tomo · tomo slice 33/66.0]
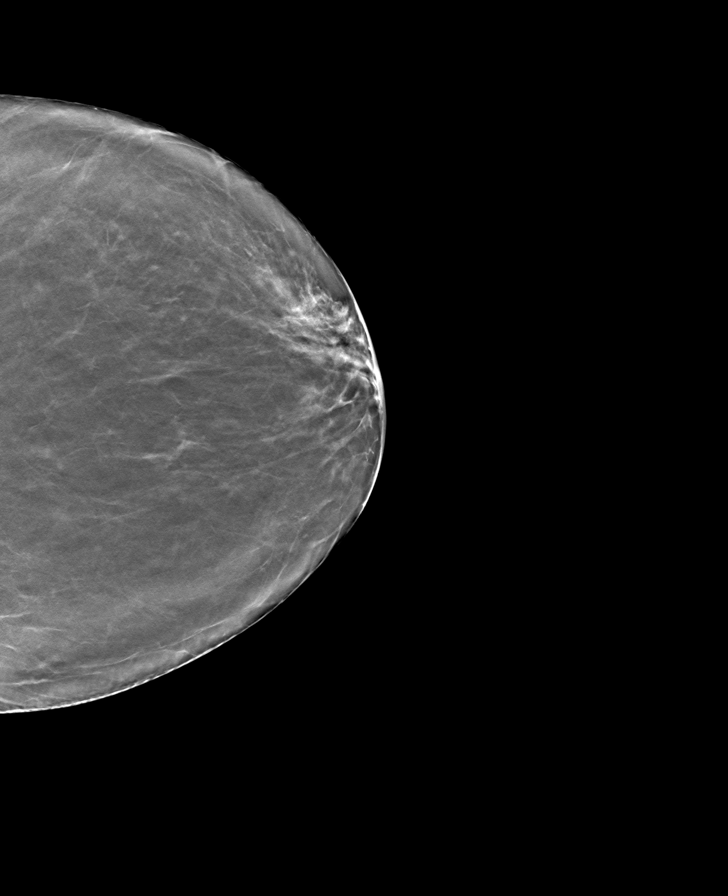

[L MLO tomo · tomo slice 39/76.0]
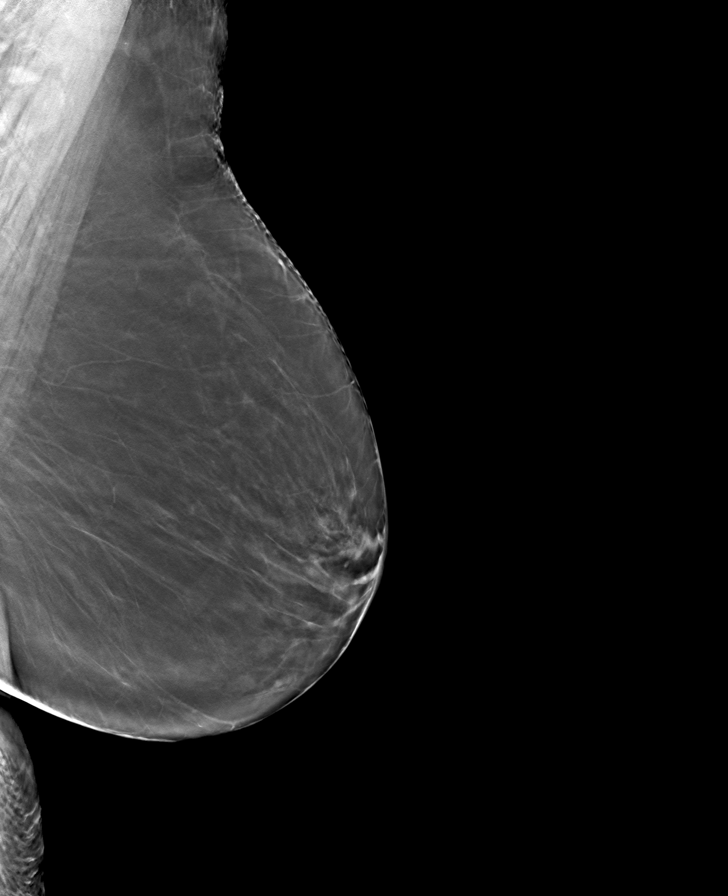

[8 of 24 positions shown; findings below may reference images not displayed]

ACR Breast Density Category b: There are scattered areas of
fibroglandular density.
FINDINGS: There are no findings suspicious for malignancy. Images were
processed with CAD.
IMPRESSION: No mammographic evidence of malignancy. A result letter of this
screening mammogram will be mailed directly to the patient.

RECOMMENDATION:
Screening mammogram in one year. (Code:[id number])

BI-RADS CATEGORY  1: Negative.

## 2019-12-19 DIAGNOSIS — H30891 Other chorioretinal inflammations, right eye: Secondary | ICD-10-CM | POA: Diagnosis not present

## 2019-12-29 ENCOUNTER — Other Ambulatory Visit: Payer: Self-pay | Admitting: Internal Medicine

## 2019-12-29 ENCOUNTER — Telehealth: Payer: Self-pay

## 2019-12-29 DIAGNOSIS — M5010 Cervical disc disorder with radiculopathy, unspecified cervical region: Secondary | ICD-10-CM

## 2019-12-29 NOTE — Telephone Encounter (Signed)
[name] from [facility] called saying they received our referral for the patient but they cannot see her without a recent imaging done of MRI or CT of the spine.  Please advise?  CB# [phone]

## 2019-12-29 NOTE — Telephone Encounter (Signed)
Please let the patient know that I have ordered the Cervical MRI.  Someone from scheduling should be calling her.

## 2020-01-01 NOTE — Telephone Encounter (Signed)
Called and left detailed msg informing of this. Told her to call with any questions.  CM

## 2020-01-09 ENCOUNTER — Ambulatory Visit: Admission: RE | Admit: 2020-01-09 | Payer: Medicare HMO | Source: Ambulatory Visit

## 2020-01-17 ENCOUNTER — Ambulatory Visit
Admission: EM | Admit: 2020-01-17 | Discharge: 2020-01-17 | Disposition: A | Discharge status: Home / Self Care | Payer: Medicare HMO | Attending: Family Medicine | Admitting: Family Medicine

## 2020-01-17 ENCOUNTER — Encounter: Payer: Self-pay | Admitting: Emergency Medicine

## 2020-01-17 ENCOUNTER — Other Ambulatory Visit: Payer: Self-pay

## 2020-01-17 DIAGNOSIS — S61209A Unspecified open wound of unspecified finger without damage to nail, initial encounter: Secondary | ICD-10-CM | POA: Diagnosis not present

## 2020-01-17 DIAGNOSIS — Z23 Encounter for immunization: Secondary | ICD-10-CM | POA: Diagnosis not present

## 2020-01-17 MED ORDER — SILVER NITRATE-POT NITRATE 75-25 % EX MISC
1.0000 | Freq: Once | CUTANEOUS | Status: AC
  Administered 2020-01-17: 10:00:00 1 via TOPICAL

## 2020-01-17 MED ORDER — TETANUS-DIPHTH-ACELL PERTUSSIS 5-2.5-18.5 LF-MCG/0.5 IM SUSP
0.5000 mL | Freq: Once | INTRAMUSCULAR | Status: AC
  Administered 2020-01-17: 0.5 mL via INTRAMUSCULAR

## 2020-01-17 NOTE — Discharge Instructions (Signed)
Keep clean and dry. Monitor.   Follow up with your primary care physician this week as needed. Return to Urgent care for new or worsening concerns.

## 2020-01-17 NOTE — ED Provider Notes (Addendum)
[facility] ____________________________________________  Time seen: Approximately 9:47 AM  I have reviewed the triage vital signs and the nursing notes.   HISTORY  Chief Complaint Extremity Laceration (right pointer finger)   HPI Erika Collins is a 74 y.o. female presenting for evaluation of right index finger laceration that occurred around 4 PM last night.  States that she was using a greeter and accidentally cut the distal end of her finger.  States she cleaned it last night and applied dressing but still had bleeding this morning which prompted her to come in.  States tetanus immunization is out of date.  Denies pain radiation, paresthesias or decreased range of motion.  Denies other injury.  Denies recent fevers or sickness.    Past Medical History:  Diagnosis Date  . Arthritis   . Glaucoma   . Osteoarthritis   . 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 Chronic bilateral low back pain with left-sided sciatica 10/11/2019  . OA (osteoarthritis) of finger, unspecified laterality 05/19/2019  . Numbness and tingling of foot 05/19/2019  . Cervical disc disorder with radiculopathy 05/17/2018  . History of TIA (transient ischemic attack) 05/17/2018  . Vitamin D deficiency 05/17/2018  . 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  . Mild hyperlipidemia 12/13/2017  . Tobacco use 12/13/2017  . Primary open angle glaucoma 01/13/2013    Past Surgical History:  Procedure Laterality Date  . COLONOSCOPY  02/24/2011   normal, repeat 10 years  . TUBAL LIGATION       No current facility-administered medications for this encounter.  Current Outpatient Medications:  .  Ascorbic Acid (VITAMIN C) 1000 MG tablet, Take 1,000 mg by mouth daily., Disp: , Rfl:  .  aspirin 81 MG tablet, Take 81 mg by mouth daily. , Disp: , Rfl:  .  brimonidine-timolol (COMBIGAN) 0.2-0.5 % ophthalmic solution, Place  1 drop into both eyes every 12 (twelve) hours., Disp: , Rfl:  .  Calcium Carbonate-Vitamin D (CALCIUM 600+D) 600-400 MG-UNIT tablet, Take 1 tablet by mouth daily., Disp: , Rfl:  .  gabapentin (NEURONTIN) 100 MG capsule, Take 1-3 capsules (100-300 mg total) by mouth at bedtime., Disp: 90 capsule, Rfl: 0  Allergies Patient has no known allergies.  Family History  Problem Relation Age of Onset  . Heart attack Father   . Hyperlipidemia Sister   . Breast cancer Neg Hx     Social History Social History   Tobacco Use  . Smoking status: Current Every Day Smoker    Packs/day: 0.25    Years: 45.00    Pack years: 11.25    Types: Cigarettes  . Smokeless tobacco: Never Used  . Tobacco comment: 3-4 cigerettes daily  Substance Use Topics  . Alcohol use: Yes    Alcohol/week: 7.0 - 10.0 standard drinks    Types: 7 - 10 Glasses of wine per week  . Drug use: Never    Review of Systems Constitutional: No fever Cardiovascular: Denies chest pain. Respiratory: Denies shortness of breath. Musculoskeletal: Negative for back pain. Skin: Finger laceration  ____________________________________________   PHYSICAL EXAM:  VITAL SIGNS: ED Triage Vitals  Enc Vitals Group     BP 01/17/20 0930 (!) 189/98     Pulse Rate 01/17/20 0930 67     Resp 01/17/20 0930 18     Temp 01/17/20 0930 98 F (36.7 C)     Temp Source 01/17/20 0930  Oral     SpO2 01/17/20 0930 100 %     Weight 01/17/20 0928 117 lb (53.1 kg)     Height 01/17/20 0928 5\' 1"  (1.549 m)     Head Circumference --      Peak Flow --      Pain Score 01/17/20 0928 0     Pain Loc --      Pain Edu? --      Excl. in [place]? --     Constitutional: Alert and oriented. Well appearing and in no acute distress. Eyes: Conjunctivae are normal.  ENT      Head: Normocephalic and atraumatic. Cardiovascular:   Good peripheral circulation. Respiratory: Normal respiratory effort without tachypnea nor retractions.  Musculoskeletal: Steady  gait Neurologic:  Normal speech and language.  Skin:  Skin is warm, dry.  Except: Right distal index finger medial aspect 1 cm distal superficial fingertip avulsion, minimal active bleeding, no surrounding erythema, mild tenderness, normal distal sensation, good distal resisted flexion and extension.  Psychiatric: Mood and affect are normal. Speech and behavior are normal. Patient exhibits appropriate insight and judgment   ___________________________________________   LABS (all labs ordered are listed, but only abnormal results are displayed)  Labs Reviewed - No data to display ____________________________________________   PROCEDURES Procedures   Procedure(s) performed:  Procedure explained and verbal consent obtained. Consent: Verbal consent obtained. Written consent not obtained. Risks and benefits: risks, benefits and alternatives were discussed Patient identity confirmed: verbally with patient and hospital-assigned identification number  Consent given by: patient   Wound cautery Location: Right index finger Foreign bodies: no foreign bodies Tendon involvement: none Nerve involvement: none Preparation: Patient was prepped and draped in the usual sterile fashion. Anesthesia none Cleaned with Betadine and saline Single silver nitrate utilized for distal cautery wound. Patient tolerate well. Wound care instructions provided.  Observe for any signs of infection or other problems.      INITIAL IMPRESSION / ASSESSMENT AND PLAN / ED COURSE  Pertinent labs & imaging results that were available during my care of the patient were reviewed by me and considered in my medical decision making (see chart for details).  Well-appearing patient.  Distal fingertip avulsion while food prep yesterday with mild active bleeding.  Copiously cleaned and irrigated.  Single silver nitrate utilized for cautery with success.  No other repair indicated.  Tetanus immunization updated.  Counseled to  wound care and wound monitoring.  Patient blood pressure elevated in office, states this is not her normal and she feels well otherwise.  States that she feels her blood pressure is elevated due to stress of coming in.  States she will continue to monitor at home and follow-up with her primary as needed.  Discussed follow up and return parameters including no resolution or any worsening concerns. Patient verbalized understanding and agreed to plan.   ____________________________________________   FINAL CLINICAL IMPRESSION(S) / ED DIAGNOSES  Final diagnoses:  Avulsion of finger tip, initial encounter     ED Discharge Orders    None       Note: This dictation was prepared with Dragon dictation along with smaller phrase technology. Any transcriptional errors that result from this process are unintentional.           Marylene Land, NP 01/17/20 1209

## 2020-01-17 NOTE — ED Triage Notes (Signed)
Patient states she cut the tip of her right pointer finger with a grater last night. She is on low dose ASA and it started bleeding again this morning. Patient also needs tetanus updated.

## 2020-01-27 ENCOUNTER — Other Ambulatory Visit: Payer: Self-pay

## 2020-01-27 ENCOUNTER — Ambulatory Visit
Admission: RE | Admit: 2020-01-27 | Discharge: 2020-01-27 | Disposition: A | Discharge status: Home / Self Care | Payer: Medicare HMO | Source: Ambulatory Visit | Attending: Internal Medicine | Admitting: Internal Medicine

## 2020-01-27 DIAGNOSIS — M4802 Spinal stenosis, cervical region: Secondary | ICD-10-CM | POA: Diagnosis not present

## 2020-01-27 DIAGNOSIS — M5010 Cervical disc disorder with radiculopathy, unspecified cervical region: Secondary | ICD-10-CM | POA: Insufficient documentation

## 2020-01-29 ENCOUNTER — Encounter: Payer: Self-pay | Admitting: Internal Medicine

## 2020-02-21 ENCOUNTER — Encounter: Payer: Self-pay | Admitting: Internal Medicine

## 2020-02-21 ENCOUNTER — Other Ambulatory Visit: Payer: Self-pay | Admitting: Internal Medicine

## 2020-02-21 DIAGNOSIS — Z1231 Encounter for screening mammogram for malignant neoplasm of breast: Secondary | ICD-10-CM

## 2020-03-19 DIAGNOSIS — H3581 Retinal edema: Secondary | ICD-10-CM | POA: Diagnosis not present

## 2020-03-19 DIAGNOSIS — H44111 Panuveitis, right eye: Secondary | ICD-10-CM | POA: Diagnosis not present

## 2020-03-19 DIAGNOSIS — H30891 Other chorioretinal inflammations, right eye: Secondary | ICD-10-CM | POA: Diagnosis not present

## 2020-03-28 ENCOUNTER — Other Ambulatory Visit: Payer: Self-pay

## 2020-03-28 ENCOUNTER — Ambulatory Visit
Admission: RE | Admit: 2020-03-28 | Discharge: 2020-03-28 | Disposition: A | Discharge status: Home / Self Care | Payer: Medicare HMO | Source: Ambulatory Visit | Attending: Internal Medicine | Admitting: Internal Medicine

## 2020-03-28 DIAGNOSIS — Z1231 Encounter for screening mammogram for malignant neoplasm of breast: Secondary | ICD-10-CM | POA: Diagnosis not present

## 2020-04-18 DIAGNOSIS — M5090 Cervical disc disorder, unspecified, unspecified cervical region: Secondary | ICD-10-CM | POA: Diagnosis not present

## 2020-05-20 ENCOUNTER — Ambulatory Visit (INDEPENDENT_AMBULATORY_CARE_PROVIDER_SITE_OTHER): Payer: Medicare HMO

## 2020-05-20 ENCOUNTER — Other Ambulatory Visit: Payer: Self-pay

## 2020-05-20 VITALS — BP 122/68 | HR 64 | Temp 98.2°F | Resp 16 | Ht 61.0 in | Wt 134.0 lb

## 2020-05-20 DIAGNOSIS — Z780 Asymptomatic menopausal state: Secondary | ICD-10-CM | POA: Diagnosis not present

## 2020-05-20 DIAGNOSIS — Z0000 Encounter for general adult medical examination without abnormal findings: Secondary | ICD-10-CM

## 2020-05-20 NOTE — Patient Instructions (Signed)
Erika Collins , Thank you for taking time to come for your Medicare Wellness Visit. I appreciate your ongoing commitment to your health goals. Please review the following plan we discussed and let me know if I can assist you in the future.   Screening recommendations/referrals: Colonoscopy: done 02/24/11. Due 02/24/2021 Mammogram: done 03/28/20 Bone Density: done 06/07/18. Please call [phone] to schedule your bone density screening.   Recommended yearly ophthalmology/optometry visit for glaucoma screening and checkup Recommended yearly dental visit for hygiene and checkup  Vaccinations: Influenza vaccine: declined Pneumococcal vaccine: done 05/19/19 Tdap vaccine: done 01/17/20 Shingles vaccine: Shingrix discussed. Please contact your pharmacy for coverage information.  Covid-19: done 11/10/19 & 12/01/19  Conditions/risks identified: Keep up the great work!  Next appointment: Follow up in one year for your annual wellness visit    Preventive Care 74 Years and Older, Female Preventive care refers to lifestyle choices and visits with your health care provider that can promote health and wellness. What does preventive care include?  A yearly physical exam. This is also called an annual well check.  Dental exams once or twice a year.  Routine eye exams. Ask your health care provider how often you should have your eyes checked.  Personal lifestyle choices, including:  Daily care of your teeth and gums.  Regular physical activity.  Eating a healthy diet.  Avoiding tobacco and drug use.  Limiting alcohol use.  Practicing safe sex.  Taking low-dose aspirin every day.  Taking vitamin and mineral supplements as recommended by your health care provider. What happens during an annual well check? The services and screenings done by your health care provider during your annual well check will depend on your age, overall health, lifestyle risk factors, and family history of  disease. Counseling  Your health care provider may ask you questions about your:  Alcohol use.  Tobacco use.  Drug use.  Emotional well-being.  Home and relationship well-being.  Sexual activity.  Eating habits.  History of falls.  Memory and ability to understand (cognition).  Work and work Statistician.  Reproductive health. Screening  You may have the following tests or measurements:  Height, weight, and BMI.  Blood pressure.  Lipid and cholesterol levels. These may be checked every 5 years, or more frequently if you are over 40 years old.  Skin check.  Lung cancer screening. You may have this screening every year starting at age 30 if you have a 30-pack-year history of smoking and currently smoke or have quit within the past 15 years.  Fecal occult blood test (FOBT) of the stool. You may have this test every year starting at age 41.  Flexible sigmoidoscopy or colonoscopy. You may have a sigmoidoscopy every 5 years or a colonoscopy every 10 years starting at age 46.  Hepatitis C blood test.  Hepatitis B blood test.  Sexually transmitted disease (STD) testing.  Diabetes screening. This is done by checking your blood sugar (glucose) after you have not eaten for a while (fasting). You may have this done every 1-3 years.  Bone density scan. This is done to screen for osteoporosis. You may have this done starting at age 65.  Mammogram. This may be done every 1-2 years. Talk to your health care provider about how often you should have regular mammograms. Talk with your health care provider about your test results, treatment options, and if necessary, the need for more tests. Vaccines  Your health care provider may recommend certain vaccines, such as:  Influenza vaccine.  This is recommended every year.  Tetanus, diphtheria, and acellular pertussis (Tdap, Td) vaccine. You may need a Td booster every 10 years.  Zoster vaccine. You may need this after age  75.  Pneumococcal 13-valent conjugate (PCV13) vaccine. One dose is recommended after age 53.  Pneumococcal polysaccharide (PPSV23) vaccine. One dose is recommended after age 48. Talk to your health care provider about which screenings and vaccines you need and how often you need them. This information is not intended to replace advice given to you by your health care provider. Make sure you discuss any questions you have with your health care provider. Document Released: 10/04/2015 Document Revised: 05/27/2016 Document Reviewed: 07/09/2015 Elsevier Interactive Patient Education  2017 [place] Prevention in the Home Falls can cause injuries. They can happen to people of all ages. There are many things you can do to make your home safe and to help prevent falls. What can I do on the outside of my home?  Regularly fix the edges of walkways and driveways and fix any cracks.  Remove anything that might make you trip as you walk through a door, such as a raised step or threshold.  Trim any bushes or trees on the path to your home.  Use bright outdoor lighting.  Clear any walking paths of anything that might make someone trip, such as rocks or tools.  Regularly check to see if handrails are loose or broken. Make sure that both sides of any steps have handrails.  Any raised decks and porches should have guardrails on the edges.  Have any leaves, snow, or ice cleared regularly.  Use sand or salt on walking paths during winter.  Clean up any spills in your garage right away. This includes oil or grease spills. What can I do in the bathroom?  Use night lights.  Install grab bars by the toilet and in the tub and shower. Do not use towel bars as grab bars.  Use non-skid mats or decals in the tub or shower.  If you need to sit down in the shower, use a plastic, non-slip stool.  Keep the floor dry. Clean up any water that spills on the floor as soon as it happens.  Remove  soap buildup in the tub or shower regularly.  Attach bath mats securely with double-sided non-slip rug tape.  Do not have throw rugs and other things on the floor that can make you trip. What can I do in the bedroom?  Use night lights.  Make sure that you have a light by your bed that is easy to reach.  Do not use any sheets or blankets that are too big for your bed. They should not hang down onto the floor.  Have a firm chair that has side arms. You can use this for support while you get dressed.  Do not have throw rugs and other things on the floor that can make you trip. What can I do in the kitchen?  Clean up any spills right away.  Avoid walking on wet floors.  Keep items that you use a lot in easy-to-reach places.  If you need to reach something above you, use a strong step stool that has a grab bar.  Keep electrical cords out of the way.  Do not use floor polish or wax that makes floors slippery. If you must use wax, use non-skid floor wax.  Do not have throw rugs and other things on the floor that can make  you trip. What can I do with my stairs?  Do not leave any items on the stairs.  Make sure that there are handrails on both sides of the stairs and use them. Fix handrails that are broken or loose. Make sure that handrails are as long as the stairways.  Check any carpeting to make sure that it is firmly attached to the stairs. Fix any carpet that is loose or worn.  Avoid having throw rugs at the top or bottom of the stairs. If you do have throw rugs, attach them to the floor with carpet tape.  Make sure that you have a light switch at the top of the stairs and the bottom of the stairs. If you do not have them, ask someone to add them for you. What else can I do to help prevent falls?  Wear shoes that:  Do not have high heels.  Have rubber bottoms.  Are comfortable and fit you well.  Are closed at the toe. Do not wear sandals.  If you use a  stepladder:  Make sure that it is fully opened. Do not climb a closed stepladder.  Make sure that both sides of the stepladder are locked into place.  Ask someone to hold it for you, if possible.  Clearly mark and make sure that you can see:  Any grab bars or handrails.  First and last steps.  Where the edge of each step is.  Use tools that help you move around (mobility aids) if they are needed. These include:  Canes.  Walkers.  Scooters.  Crutches.  Turn on the lights when you go into a dark area. Replace any light bulbs as soon as they burn out.  Set up your furniture so you have a clear path. Avoid moving your furniture around.  If any of your floors are uneven, fix them.  If there are any pets around you, be aware of where they are.  Review your medicines with your doctor. Some medicines can make you feel dizzy. This can increase your chance of falling. Ask your doctor what other things that you can do to help prevent falls. This information is not intended to replace advice given to you by your health care provider. Make sure you discuss any questions you have with your health care provider. Document Released: 07/04/2009 Document Revised: 02/13/2016 Document Reviewed: 10/12/2014 Elsevier Interactive Patient Education  2017 [employer].

## 2020-05-20 NOTE — Progress Notes (Signed)
Subjective:   Erika Collins is a 74 y.o. female who presents for Medicare Annual (Subsequent) preventive examination.   Review of Systems     Cardiac Risk Factors include: advanced age (>36men, >51 women)     Objective:    Today's Vitals   05/20/20 1121  BP: 122/68  Pulse: 64  Resp: 16  Temp: 98.2 F (36.8 C)  TempSrc: Oral  SpO2: 99%  Weight: 134 lb (60.8 kg)  Height: 5\' 1"  (1.549 m)   Body mass index is 25.32 kg/m.  Advanced Directives 05/17/2019 05/11/2018  Does Patient Have a Medical Advance Directive? Yes No  Type of Paramedic of [place];Living will -  Copy of [place] in Chart? Yes - validated most recent copy scanned in chart (See row information) -  Would patient like information on creating a medical advance directive? - Yes (MAU/Ambulatory/Procedural Areas - Information given)    Current Medications (verified) Outpatient Encounter Medications as of 05/20/2020  Medication Sig  . Ascorbic Acid (VITAMIN C) 1000 MG tablet Take 1,000 mg by mouth daily.  Marland Kitchen aspirin 81 MG tablet Take 81 mg by mouth daily.   . brimonidine-timolol (COMBIGAN) 0.2-0.5 % ophthalmic solution Place 1 drop into both eyes every 12 (twelve) hours.  . Calcium Carbonate-Vitamin D (CALCIUM 600+D) 600-400 MG-UNIT tablet Take 1 tablet by mouth daily.  . dorzolamide (TRUSOPT) 2 % ophthalmic solution 1 drop 2 (two) times daily.  . [DISCONTINUED] gabapentin (NEURONTIN) 100 MG capsule Take 1-3 capsules (100-300 mg total) by mouth at bedtime.   No facility-administered encounter medications on file as of 05/20/2020.    Allergies (verified) Patient has no known allergies.   History: Past Medical History:  Diagnosis Date  . Arthritis   . Glaucoma   . Osteoarthritis   . Vertebral collapse (HCC)    C3,4,5,6,7 compressed over 50 years ago   Past Surgical History:  Procedure Laterality Date  . COLONOSCOPY  02/24/2011   normal, repeat 10 years  . TUBAL  LIGATION     Family History  Problem Relation Age of Onset  . Heart attack Father   . Hyperlipidemia Sister   . Breast cancer Neg Hx    Social History   Socioeconomic History  . Marital status: Widowed    Spouse name: Not on file  . Number of children: 1  . Years of education: some college  . Highest education level: 12th grade  Occupational History  . Occupation: Retired  Tobacco Use  . Smoking status: Current Every Day Smoker    Packs/day: 0.25    Years: 45.00    Pack years: 11.25    Types: Cigarettes  . Smokeless tobacco: Never Used  . Tobacco comment: 3-4 cigerettes daily  Vaping Use  . Vaping Use: Never used  Substance and Sexual Activity  . Alcohol use: Yes    Alcohol/week: 7.0 - 10.0 standard drinks    Types: 7 - 10 Glasses of wine per week  . Drug use: Never  . Sexual activity: Not Currently  Other Topics Concern  . Not on file  Social History Narrative   Pt lives alone.    Social Determinants of Health   Financial Resource Strain: Low Risk   . Difficulty of Paying Living Expenses: Not hard at all  Food Insecurity: No Food Insecurity  . Worried About Charity fundraiser in the Last Year: Never true  . Ran Out of Food in the Last Year: Never true  Transportation  Needs: No Transportation Needs  . Lack of Transportation (Medical): No  . Lack of Transportation (Non-Medical): No  Physical Activity: Sufficiently Active  . Days of Exercise per Week: 7 days  . Minutes of Exercise per Session: 40 min  Stress: No Stress Concern Present  . Feeling of Stress : Only a little  Social Connections: Socially Isolated  . Frequency of Communication with Friends and Family: More than three times a week  . Frequency of Social Gatherings with Friends and Family: Twice a week  . Attends Religious Services: Never  . Active Member of Clubs or Organizations: No  . Attends Archivist Meetings: Never  . Marital Status: Widowed    Tobacco Counseling Ready to quit:  Not Answered Counseling given: Not Answered Comment: 3-4 cigerettes daily   Clinical Intake:  Pre-visit preparation completed: Yes  Pain : No/denies pain     BMI - recorded: 25.32 Nutritional Status: BMI 25 -29 Overweight Nutritional Risks: None Diabetes: No  How often do you need to have someone help you when you read instructions, pamphlets, or other written materials from your doctor or pharmacy?: 1 - Never    Interpreter Needed?: No  Information entered by :: Clemetine Marker LPN   Activities of Daily Living In your present state of health, do you have any difficulty performing the following activities: 05/20/2020  Hearing? N  Comment declines hearing aids  Vision? N  Difficulty concentrating or making decisions? N  Walking or climbing stairs? N  Dressing or bathing? N  Doing errands, shopping? N  Preparing Food and eating ? N  Using the Toilet? N  In the past six months, have you accidently leaked urine? N  Do you have problems with loss of bowel control? N  Managing your Medications? N  Managing your Finances? N  Housekeeping or managing your Housekeeping? N  Some recent data might be hidden    Patient Care Team: Glean Hess, MD as PCP - General (Internal Medicine) Karren Burly Deirdre Peer, MD as Consulting Physician (Ophthalmology) Dr. Kallie Locks as Consulting Physician ([facility])  Indicate any recent Medical Services you may have received from other than [facility] providers in the past year (date may be approximate).     Assessment:   This is a routine wellness examination for Cristabel.  Hearing/Vision screen  Hearing Screening   125Hz  250Hz  500Hz  1000Hz  2000Hz  3000Hz  4000Hz  6000Hz  8000Hz   Right ear:           Left ear:           Comments: Pt denies hearing difficulty  Vision Screening Comments: Annual vision screenings done with Dr. Joan Mayans with [facility] through [place] [facility] and pt has injections done every 3 months  with Dr. Jeannett Senior   Dietary issues and exercise activities discussed: Current Exercise Habits: Home exercise routine, Type of exercise: walking;Other - see comments (silver sneakers), Time (Minutes): 40, Frequency (Times/Week): 7, Weekly Exercise (Minutes/Week): 280, Intensity: Moderate, Exercise limited by: None identified  Goals    . DIET - INCREASE WATER INTAKE     Recommend to drink at least 6-8 8oz glasses of water per day.      Depression Screen PHQ 2/9 Scores 05/20/2020 10/11/2019 05/17/2019 05/17/2018 05/11/2018 12/13/2017  PHQ - 2 Score 0 0 1 0 0 0  PHQ- 9 Score - - 2 0 0 -    Fall Risk Fall Risk  05/20/2020 10/11/2019 05/17/2019 05/17/2018 05/11/2018  Falls in the past year? 0 0  1 No No  Number falls in past yr: 0 0 1 - -  Injury with Fall? 0 0 0 - -  Risk for fall due to : Impaired vision - Impaired vision - Impaired vision;History of fall(s);Medication side effect  Risk for fall due to: Comment - - - - wears eyeglasses; tripped over pet; eye gtts cause dizziness  Follow up Falls prevention discussed Falls evaluation completed Falls prevention discussed - -    Any stairs in or around the home? No  If so, are there any without handrails? No  Home free of loose throw rugs in walkways, pet beds, electrical cords, etc? Yes  Adequate lighting in your home to reduce risk of falls? Yes   ASSISTIVE DEVICES UTILIZED TO PREVENT FALLS:  Life alert? No  Use of a cane, walker or w/c? No  Grab bars in the bathroom? Yes  Shower chair or bench in shower? Yes  Elevated toilet seat or a handicapped toilet? No   TIMED UP AND GO:  Was the test performed? Yes .  Length of time to ambulate 10 feet: 5 sec.   Gait steady and fast without use of assistive device  Cognitive Function:     6CIT Screen 05/20/2020 05/17/2019 05/11/2018  What Year? 0 points 0 points 0 points  What month? 0 points 0 points 0 points  What time? 0 points 0 points 0 points  Count back from 20 0 points 0 points 0 points    Months in reverse 0 points 0 points 0 points  Repeat phrase 0 points 2 points 0 points  Total Score 0 2 0    Immunizations Immunization History  Administered Date(s) Administered  . PFIZER SARS-COV-2 Vaccination 11/10/2019, 12/01/2019  . Pneumococcal Conjugate-13 05/19/2019  . Pneumococcal Polysaccharide-23 05/17/2018  . Tdap 01/17/2020     TDAP status: Up to date   Flu Vaccine status: Declined, Education has been provided regarding the importance of this vaccine but patient still declined. Advised may receive this vaccine at local pharmacy or Health Dept. Aware to provide a copy of the vaccination record if obtained from local pharmacy or Health Dept. Verbalized acceptance and understanding.   Pneumococcal vaccine status: Up to date   Covid-19 vaccine status: Completed vaccines  Qualifies for Shingles Vaccine? Yes   Zostavax completed No   Shingrix Completed?: No.    Education has been provided regarding the importance of this vaccine. Patient has been advised to call insurance company to determine out of pocket expense if they have not yet received this vaccine. Advised may also receive vaccine at local pharmacy or Health Dept. Verbalized acceptance and understanding.  Screening Tests Health Maintenance  Topic Date Due  . Hepatitis C Screening  Never done  . INFLUENZA VACCINE  04/21/2020  . COLONOSCOPY  02/23/2021  . MAMMOGRAM  03/28/2021  . TETANUS/TDAP  01/16/2030  . DEXA SCAN  Completed  . COVID-19 Vaccine  Completed  . PNA vac Low Risk Adult  Completed    Health Maintenance  Health Maintenance Due  Topic Date Due  . Hepatitis C Screening  Never done  . INFLUENZA VACCINE  04/21/2020    Colorectal cancer screening: Completed 02/24/11. Repeat every 10 years   Mammogram status: Completed 03/28/20. Repeat every year   Bone Density status: Completed 06/07/18. Results reflect: Bone density results: OSTEOPENIA. Repeat every 2 years. Ordered today.   Lung Cancer  Screening: (Low Dose CT Chest recommended if Age 38-80 years, 30 pack-year currently smoking OR have quit  w/in 15years.) does not qualify.   Additional Screening:  Hepatitis C Screening: does qualify; postponed  Vision Screening: Recommended annual ophthalmology exams for early detection of glaucoma and other disorders of the eye. Is the patient up to date with their annual eye exam?  Yes  Who is the provider or what is the name of the office in which the patient attends annual eye exams? Dr. Cephus Shelling  Dental Screening: Recommended annual dental exams for proper oral hygiene  Community Resource Referral / Chronic Care Management: CRR required this visit?  No   CCM required this visit?  No      Plan:     I have personally reviewed and noted the following in the patient's chart:   . Medical and social history . Use of alcohol, tobacco or illicit drugs  . Current medications and supplements . Functional ability and status . Nutritional status . Physical activity . Advanced directives . List of other physicians . Hospitalizations, surgeries, and ER visits in previous 12 months . Vitals . Screenings to include cognitive, depression, and falls . Referrals and appointments  In addition, I have reviewed and discussed with patient certain preventive protocols, quality metrics, and best practice recommendations. A written personalized care plan for preventive services as well as general preventive health recommendations were provided to patient.     Clemetine Marker, LPN   6/68/1594   Nurse Notes: none

## 2020-05-21 ENCOUNTER — Encounter: Payer: Self-pay | Admitting: Internal Medicine

## 2020-05-21 ENCOUNTER — Other Ambulatory Visit
Admission: RE | Admit: 2020-05-21 | Discharge: 2020-05-21 | Disposition: A | Discharge status: Home / Self Care | Payer: Medicare HMO | Attending: Internal Medicine | Admitting: Internal Medicine

## 2020-05-21 ENCOUNTER — Ambulatory Visit (INDEPENDENT_AMBULATORY_CARE_PROVIDER_SITE_OTHER): Payer: Medicare HMO | Admitting: Internal Medicine

## 2020-05-21 VITALS — BP 138/80 | HR 79 | Temp 98.6°F | Ht 61.0 in | Wt 134.0 lb

## 2020-05-21 DIAGNOSIS — E559 Vitamin D deficiency, unspecified: Secondary | ICD-10-CM | POA: Insufficient documentation

## 2020-05-21 DIAGNOSIS — E785 Hyperlipidemia, unspecified: Secondary | ICD-10-CM

## 2020-05-21 DIAGNOSIS — Z0000 Encounter for general adult medical examination without abnormal findings: Secondary | ICD-10-CM

## 2020-05-21 DIAGNOSIS — Z1159 Encounter for screening for other viral diseases: Secondary | ICD-10-CM

## 2020-05-21 LAB — COMPREHENSIVE METABOLIC PANEL
ALT: 38 U/L (ref 0–44)
AST: 30 U/L (ref 15–41)
Albumin: 4.1 g/dL (ref 3.5–5.0)
Alkaline Phosphatase: 47 U/L (ref 38–126)
Anion gap: 8 (ref 5–15)
BUN: 14 mg/dL (ref 8–23)
CO2: 24 mmol/L (ref 22–32)
Calcium: 9.4 mg/dL (ref 8.9–10.3)
Chloride: 104 mmol/L (ref 98–111)
Creatinine, Ser: 0.72 mg/dL (ref 0.44–1.00)
GFR calc Af Amer: >60 mL/min (ref >60)
GFR calc non Af Amer: >60 mL/min (ref >60)
Glucose, Bld: 121 mg/dL — ABNORMAL HIGH (ref 70–99)
Potassium: 4.1 mmol/L (ref 3.5–5.1)
Sodium: 136 mmol/L (ref 135–145)
Total Bilirubin: 0.7 mg/dL (ref 0.3–1.2)
Total Protein: 7.9 g/dL (ref 6.5–8.1)

## 2020-05-21 LAB — POCT URINALYSIS DIPSTICK
Bilirubin, UA: NEGATIVE
Glucose, UA: NEGATIVE
Ketones, UA: NEGATIVE
Nitrite, UA: NEGATIVE
Protein, UA: NEGATIVE
Spec Grav, UA: <=1.005 — AB (ref 1.010–1.025)
Urobilinogen, UA: 0.2 E.U./dL
pH, UA: 7.5 (ref 5.0–8.0)

## 2020-05-21 LAB — CBC WITH DIFFERENTIAL/PLATELET
Abs Immature Granulocytes: 0.01 10*3/uL (ref 0.00–0.07)
Basophils Absolute: 0 10*3/uL (ref 0.0–0.1)
Basophils Relative: 1 %
Eosinophils Absolute: 0.4 10*3/uL (ref 0.0–0.5)
Eosinophils Relative: 7 %
HCT: 39.1 % (ref 36.0–46.0)
Hemoglobin: 13.2 g/dL (ref 12.0–15.0)
Immature Granulocytes: 0 %
Lymphocytes Relative: 24 %
Lymphs Abs: 1.2 10*3/uL (ref 0.7–4.0)
MCH: 32.4 pg (ref 26.0–34.0)
MCHC: 33.8 g/dL (ref 30.0–36.0)
MCV: 96.1 fL (ref 80.0–100.0)
Monocytes Absolute: 0.5 10*3/uL (ref 0.1–1.0)
Monocytes Relative: 10 %
Neutro Abs: 2.8 10*3/uL (ref 1.7–7.7)
Neutrophils Relative %: 58 %
Platelets: 149 10*3/uL — ABNORMAL LOW (ref 150–400)
RBC: 4.07 MIL/uL (ref 3.87–5.11)
RDW: 13 % (ref 11.5–15.5)
WBC: 4.9 10*3/uL (ref 4.0–10.5)
nRBC: 0 % (ref 0.0–0.2)

## 2020-05-21 LAB — LIPID PANEL
Cholesterol: 257 mg/dL — ABNORMAL HIGH (ref 0–200)
HDL: 51 mg/dL (ref >40)
LDL Cholesterol: 173 mg/dL — ABNORMAL HIGH (ref 0–99)
Total CHOL/HDL Ratio: 5 RATIO
Triglycerides: 167 mg/dL — ABNORMAL HIGH (ref <150)
VLDL: 33 mg/dL (ref 0–40)

## 2020-05-21 LAB — VITAMIN D 25 HYDROXY (VIT D DEFICIENCY, FRACTURES): Vit D, 25-Hydroxy: 32.52 ng/mL (ref 30–100)

## 2020-05-21 LAB — HEPATITIS C ANTIBODY: HCV Ab: NONREACTIVE

## 2020-05-21 NOTE — Progress Notes (Signed)
Date:  05/21/2020   Name:  Erika Collins   DOB:  23-Mar-1946   MRN:  [id number]   Chief Complaint: Annual Exam (breast exam- no pap/ aged out.)  Erika Collins is a 74 y.o. female who presents today for her Complete Annual Exam. She feels well. She reports exercising - walking every day. She reports she is sleeping well. Breast complaints - none. She is staying busy caring for her dog with health problems.  Recent MAW with [facility].  Mammogram: 03/2020 DEXA: ordered Pap smear: aged out Colonoscopy: 02/2011 repeat 2022  Immunization History  Administered Date(s) Administered  . PFIZER SARS-COV-2 Vaccination 11/10/2019, 12/01/2019  . Pneumococcal Conjugate-13 05/19/2019  . Pneumococcal Polysaccharide-23 05/17/2018  . Tdap 01/17/2020    HPI  Lab Results  Component Value Date   CREATININE 0.79 05/19/2019   BUN 13 05/19/2019   NA 140 05/19/2019   K 4.3 05/19/2019   CL 103 05/19/2019   CO2 24 05/19/2019   Lab Results  Component Value Date   CHOL 217 (H) 05/19/2019   HDL 49 05/19/2019   LDLCALC 144 (H) 05/19/2019   TRIG 122 05/19/2019   CHOLHDL 4.4 05/19/2019   Lab Results  Component Value Date   TSH 1.160 05/19/2019   No results found for: HGBA1C Lab Results  Component Value Date   WBC 5.4 05/19/2019   HGB 13.3 05/19/2019   HCT 39.2 05/19/2019   MCV 96 05/19/2019   PLT 155 05/19/2019   Lab Results  Component Value Date   ALT 24 05/19/2019   AST 24 05/19/2019   ALKPHOS 67 05/19/2019   BILITOT 0.5 05/19/2019   Last vitamin D Lab Results  Component Value Date   VD25OH 33.6 05/19/2019    Review of Systems  Constitutional: Negative for chills, fatigue and fever.  HENT: Negative for congestion, hearing loss, tinnitus, trouble swallowing and voice change.   Eyes: Negative for visual disturbance.  Respiratory: Negative for cough, chest tightness, shortness of breath and wheezing.   Cardiovascular: Negative for chest pain, palpitations and leg swelling.    Gastrointestinal: Negative for abdominal pain, constipation, diarrhea and vomiting.  Endocrine: Negative for polydipsia and polyuria.  Genitourinary: Negative for dysuria, frequency, genital sores, vaginal bleeding and vaginal discharge.  Musculoskeletal: Negative for arthralgias, gait problem and joint swelling.  Skin: Negative for color change and rash.  Neurological: Negative for dizziness, tremors, light-headedness and headaches.  Hematological: Negative for adenopathy. Does not bruise/bleed easily.  Psychiatric/Behavioral: Negative for dysphoric mood and sleep disturbance. The patient is not nervous/anxious.     Patient Active Problem List   Diagnosis Date Noted  . Chronic bilateral low back pain with left-sided sciatica 10/11/2019  . OA (osteoarthritis) of finger, unspecified laterality 05/19/2019  . Numbness and tingling of foot 05/19/2019  . Cervical disc disorder with radiculopathy 05/17/2018  . History of TIA (transient ischemic attack) 05/17/2018  . Vitamin D deficiency 05/17/2018  . 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  . Mild hyperlipidemia 12/13/2017  . Tobacco use 12/13/2017  . Primary open angle glaucoma 01/13/2013    No Known Allergies  Past Surgical History:  Procedure Laterality Date  . COLONOSCOPY  02/24/2011   normal, repeat 10 years  . TUBAL LIGATION      Social History   Tobacco Use  . Smoking status: Current Every Day Smoker    Packs/day: 0.25    Years: 45.00    Pack years: 11.25    Types: Cigarettes  . Smokeless tobacco: Never Used  .  Tobacco comment: 3-4 cigerettes daily  Vaping Use  . Vaping Use: Never used  Substance Use Topics  . Alcohol use: Yes    Alcohol/week: 7.0 - 10.0 standard drinks    Types: 7 - 10 Glasses of wine per week  . Drug use: Never     Medication list has been reviewed and updated.  Current Meds  Medication Sig  . Ascorbic Acid (VITAMIN C) 1000 MG tablet Take 1,000 mg by mouth daily.  Marland Kitchen aspirin 81 MG tablet Take  81 mg by mouth daily.   . brimonidine-timolol (COMBIGAN) 0.2-0.5 % ophthalmic solution Place 1 drop into both eyes every 12 (twelve) hours.  . Calcium Carbonate-Vitamin D (CALCIUM 600+D) 600-400 MG-UNIT tablet Take 1 tablet by mouth daily.  . dorzolamide (TRUSOPT) 2 % ophthalmic solution 1 drop 2 (two) times daily.    PHQ 2/9 Scores 05/20/2020 10/11/2019 05/17/2019 05/17/2018  PHQ - 2 Score 0 0 1 0  PHQ- 9 Score - - 2 0    No flowsheet data found.  BP Readings from Last 3 Encounters:  05/21/20 138/80  05/20/20 122/68  01/17/20 (!) 189/98    Physical Exam Vitals and nursing note reviewed.  Constitutional:      General: She is not in acute distress.    Appearance: She is well-developed.  HENT:     Head: Normocephalic and atraumatic.     Right Ear: Tympanic membrane and ear canal normal.     Left Ear: Tympanic membrane and ear canal normal.     Nose:     Right Sinus: No maxillary sinus tenderness.     Left Sinus: No maxillary sinus tenderness.  Eyes:     General: No scleral icterus.       Right eye: No discharge.        Left eye: No discharge.     Conjunctiva/sclera: Conjunctivae normal.  Neck:     Thyroid: No thyromegaly.     Vascular: No carotid bruit.  Cardiovascular:     Rate and Rhythm: Normal rate and regular rhythm.     Pulses: Normal pulses.     Heart sounds: Normal heart sounds.  Pulmonary:     Effort: Pulmonary effort is normal. No respiratory distress.     Breath sounds: No wheezing.  Chest:     Breasts:        Right: No mass, nipple discharge, skin change or tenderness.        Left: No mass, nipple discharge, skin change or tenderness.  Abdominal:     General: Bowel sounds are normal.     Palpations: Abdomen is soft.     Tenderness: There is no abdominal tenderness.  Musculoskeletal:     Cervical back: Normal range of motion. No erythema.     Right lower leg: No edema.     Left lower leg: No edema.  Lymphadenopathy:     Cervical: No cervical adenopathy.   Skin:    General: Skin is warm and dry.     Capillary Refill: Capillary refill takes less than 2 seconds.     Findings: No rash.  Neurological:     General: No focal deficit present.     Mental Status: She is alert and oriented to person, place, and time.     Cranial Nerves: No cranial nerve deficit.     Sensory: No sensory deficit.     Deep Tendon Reflexes: Reflexes are normal and symmetric.  Psychiatric:        Attention  and Perception: Attention normal.        Mood and Affect: Mood normal.     Wt Readings from Last 3 Encounters:  05/21/20 134 lb (60.8 kg)  05/20/20 134 lb (60.8 kg)  01/17/20 117 lb (53.1 kg)    BP 138/80   Pulse 79   Temp 98.6 F (37 C) (Oral)   Ht 5\' 1"  (1.549 m)   Wt 134 lb (60.8 kg)   SpO2 97%   BMI 25.32 kg/m   Assessment and Plan: 1. Annual physical exam Normal exam Continue exercise and healthy diet Mammogram up to date Immunizations are up to date - CBC with Differential/Platelet - Comprehensive metabolic panel - POCT urinalysis dipstick  2. Mild hyperlipidemia - Lipid panel  3. Vitamin D deficiency Continue supplementation; check levels and advise if higher dose is needed DEXA to be scheduled - VITAMIN D 25 Hydroxy (Vit-D Deficiency, Fractures)  4. Need for hepatitis C screening test - Hepatitis C antibody   Partially dictated using Editor, commissioning. Any errors are unintentional.  Halina Maidens, MD [facility]  05/21/2020

## 2020-05-22 ENCOUNTER — Other Ambulatory Visit: Payer: Self-pay | Admitting: Internal Medicine

## 2020-05-22 ENCOUNTER — Other Ambulatory Visit: Payer: Self-pay

## 2020-05-22 DIAGNOSIS — E785 Hyperlipidemia, unspecified: Secondary | ICD-10-CM

## 2020-05-22 DIAGNOSIS — R7309 Other abnormal glucose: Secondary | ICD-10-CM

## 2020-05-22 MED ORDER — ATORVASTATIN CALCIUM 10 MG PO TABS: 10.0000 mg | ORAL_TABLET | Freq: Every day | ORAL | 1 refills | Status: DC

## 2020-05-23 DIAGNOSIS — R7309 Other abnormal glucose: Secondary | ICD-10-CM | POA: Diagnosis not present

## 2020-05-24 LAB — HEMOGLOBIN A1C
Est. average glucose Bld gHb Est-mCnc: 123 mg/dL
Hgb A1c MFr Bld: 5.9 % — ABNORMAL HIGH (ref 4.8–5.6)

## 2020-06-03 ENCOUNTER — Telehealth: Payer: Self-pay | Admitting: Internal Medicine

## 2020-06-03 NOTE — Telephone Encounter (Signed)
Copied from [place] [id number]. Topic: General - Call Back - No Documentation >> Jun 03, 2020 11:58 AM Erick Blinks wrote: Reason for CRM: Pt called reporting that PCP wants her to schedule a colonoscopy at a specific location. The patient wants the phone number, I dont see anything charted. Best contact: [phone]

## 2020-06-03 NOTE — Telephone Encounter (Signed)
Please Advise. Pt said she has talked to you about colonoscopy. I don't see anything in notes as to where she is supposed to go.  KP

## 2020-06-03 NOTE — Telephone Encounter (Signed)
Her colonoscopy is not due until next June.

## 2020-06-04 NOTE — Telephone Encounter (Signed)
Sent patient a my chart message informing of this.  CM

## 2020-06-10 ENCOUNTER — Other Ambulatory Visit: Payer: Self-pay

## 2020-06-10 ENCOUNTER — Ambulatory Visit
Admission: RE | Admit: 2020-06-10 | Discharge: 2020-06-10 | Disposition: A | Discharge status: Home / Self Care | Payer: Medicare HMO | Source: Ambulatory Visit | Attending: Internal Medicine | Admitting: Internal Medicine

## 2020-06-10 DIAGNOSIS — Z780 Asymptomatic menopausal state: Secondary | ICD-10-CM | POA: Insufficient documentation

## 2020-06-10 DIAGNOSIS — M8589 Other specified disorders of bone density and structure, multiple sites: Secondary | ICD-10-CM | POA: Diagnosis not present

## 2020-06-10 DIAGNOSIS — R634 Abnormal weight loss: Secondary | ICD-10-CM | POA: Diagnosis not present

## 2020-06-10 DIAGNOSIS — R29890 Loss of height: Secondary | ICD-10-CM | POA: Diagnosis not present

## 2020-06-18 DIAGNOSIS — H30891 Other chorioretinal inflammations, right eye: Secondary | ICD-10-CM | POA: Diagnosis not present

## 2020-06-18 DIAGNOSIS — H3581 Retinal edema: Secondary | ICD-10-CM | POA: Diagnosis not present

## 2020-06-18 DIAGNOSIS — H44111 Panuveitis, right eye: Secondary | ICD-10-CM | POA: Diagnosis not present

## 2020-08-26 DIAGNOSIS — D225 Melanocytic nevi of trunk: Secondary | ICD-10-CM | POA: Diagnosis not present

## 2020-08-26 DIAGNOSIS — L821 Other seborrheic keratosis: Secondary | ICD-10-CM | POA: Diagnosis not present

## 2020-08-27 DIAGNOSIS — H3581 Retinal edema: Secondary | ICD-10-CM | POA: Diagnosis not present

## 2020-08-27 DIAGNOSIS — H44113 Panuveitis, bilateral: Secondary | ICD-10-CM | POA: Diagnosis not present

## 2020-08-27 DIAGNOSIS — H35373 Puckering of macula, bilateral: Secondary | ICD-10-CM | POA: Diagnosis not present

## 2020-08-27 DIAGNOSIS — H30893 Other chorioretinal inflammations, bilateral: Secondary | ICD-10-CM | POA: Diagnosis not present

## 2020-09-24 ENCOUNTER — Other Ambulatory Visit: Payer: Self-pay

## 2020-09-24 ENCOUNTER — Ambulatory Visit (INDEPENDENT_AMBULATORY_CARE_PROVIDER_SITE_OTHER): Payer: Medicare HMO | Admitting: Internal Medicine

## 2020-09-24 ENCOUNTER — Encounter: Payer: Self-pay | Admitting: Internal Medicine

## 2020-09-24 VITALS — BP 138/78 | HR 70 | Temp 98.1°F | Ht 61.0 in | Wt 136.0 lb

## 2020-09-24 DIAGNOSIS — E782 Mixed hyperlipidemia: Secondary | ICD-10-CM

## 2020-09-24 DIAGNOSIS — M19049 Primary osteoarthritis, unspecified hand: Secondary | ICD-10-CM | POA: Diagnosis not present

## 2020-09-24 NOTE — Progress Notes (Signed)
Date:  09/24/2020   Name:  Erika Collins   DOB:  November 18, 1945   MRN:  [id number]   Chief Complaint: Hyperlipidemia  Hyperlipidemia This is a chronic problem. Pertinent negatives include no chest pain, myalgias or shortness of breath. Current antihyperlipidemic treatment includes statins (started last visit).    Lab Results  Component Value Date   CREATININE 0.72 05/21/2020   BUN 14 05/21/2020   NA 136 05/21/2020   K 4.1 05/21/2020   CL 104 05/21/2020   CO2 24 05/21/2020   Lab Results  Component Value Date   CHOL 257 (H) 05/21/2020   HDL 51 05/21/2020   LDLCALC 173 (H) 05/21/2020   TRIG 167 (H) 05/21/2020   CHOLHDL 5.0 05/21/2020   Lab Results  Component Value Date   TSH 1.160 05/19/2019   Lab Results  Component Value Date   HGBA1C 5.9 (H) 05/23/2020   Lab Results  Component Value Date   WBC 4.9 05/21/2020   HGB 13.2 05/21/2020   HCT 39.1 05/21/2020   MCV 96.1 05/21/2020   PLT 149 (L) 05/21/2020   Lab Results  Component Value Date   ALT 38 05/21/2020   AST 30 05/21/2020   ALKPHOS 47 05/21/2020   BILITOT 0.7 05/21/2020     Review of Systems  Constitutional: Negative for chills, fever and unexpected weight change.  HENT: Negative for trouble swallowing.   Respiratory: Negative for cough, shortness of breath and wheezing.   Cardiovascular: Negative for chest pain and leg swelling.  Gastrointestinal: Negative for abdominal pain, constipation and diarrhea.  Genitourinary: Negative for dysuria and frequency.  Musculoskeletal: Positive for arthralgias. Negative for myalgias.  Neurological: Negative for dizziness and headaches.  Psychiatric/Behavioral: Negative for sleep disturbance. The patient is not nervous/anxious.     Patient Active Problem List   Diagnosis Date Noted  . Chronic bilateral low back pain with left-sided sciatica 10/11/2019  . OA (osteoarthritis) of finger, unspecified laterality 05/19/2019  . Numbness and tingling of foot 05/19/2019  .  Cervical disc disorder with radiculopathy 05/17/2018  . History of TIA (transient ischemic attack) 05/17/2018  . Vitamin D deficiency 05/17/2018  . 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  . Mixed hyperlipidemia 12/13/2017  . Tobacco use 12/13/2017  . Primary open angle glaucoma 01/13/2013    No Known Allergies  Past Surgical History:  Procedure Laterality Date  . COLONOSCOPY  02/24/2011   normal, repeat 10 years  . TUBAL LIGATION      Social History   Tobacco Use  . Smoking status: Current Every Day Smoker    Packs/day: 0.25    Years: 45.00    Pack years: 11.25    Types: Cigarettes  . Smokeless tobacco: Never Used  . Tobacco comment: 3-4 cigerettes daily  Vaping Use  . Vaping Use: Never used  Substance Use Topics  . Alcohol use: Yes    Alcohol/week: 7.0 - 10.0 standard drinks    Types: 7 - 10 Glasses of wine per week  . Drug use: Never     Medication list has been reviewed and updated.  Current Meds  Medication Sig  . Ascorbic Acid (VITAMIN C) 1000 MG tablet Take 1,000 mg by mouth daily.  Marland Kitchen aspirin 81 MG tablet Take 81 mg by mouth daily.   Marland Kitchen atorvastatin (LIPITOR) 10 MG tablet Take 1 tablet (10 mg total) by mouth daily.  . brimonidine-timolol (COMBIGAN) 0.2-0.5 % ophthalmic solution Place 1 drop into both eyes every 12 (twelve) hours.  . Calcium Carbonate-Vitamin  D 600-400 MG-UNIT tablet Take 1 tablet by mouth daily.  . dorzolamide (TRUSOPT) 2 % ophthalmic solution 1 drop 2 (two) times daily.    PHQ 2/9 Scores 09/24/2020 05/20/2020 10/11/2019 05/17/2019  PHQ - 2 Score 0 0 0 1  PHQ- 9 Score 1 - - 2    GAD 7 : Generalized Anxiety Score 09/24/2020  Nervous, Anxious, on Edge 0  Control/stop worrying 0  Worry too much - different things 0  Trouble relaxing 0  Restless 0  Easily annoyed or irritable 0  Afraid - awful might happen 0  Total GAD 7 Score 0    BP Readings from Last 3 Encounters:  09/24/20 138/78  05/21/20 138/80  05/20/20 122/68    Physical  Exam Vitals and nursing note reviewed.  Constitutional:      General: She is not in acute distress.    Appearance: Normal appearance. She is well-developed.  HENT:     Head: Normocephalic and atraumatic.  Neck:     Vascular: No carotid bruit.  Cardiovascular:     Rate and Rhythm: Normal rate and regular rhythm.     Pulses: Normal pulses.  Pulmonary:     Effort: Pulmonary effort is normal. No respiratory distress.     Breath sounds: No wheezing or rhonchi.  Musculoskeletal:     Cervical back: Normal range of motion.     Right lower leg: No edema.     Left lower leg: No edema.  Lymphadenopathy:     Cervical: No cervical adenopathy.  Skin:    General: Skin is warm and dry.     Findings: No rash.  Neurological:     General: No focal deficit present.     Mental Status: She is alert and oriented to person, place, and time.  Psychiatric:        Mood and Affect: Mood and affect and mood normal.     Wt Readings from Last 3 Encounters:  09/24/20 136 lb (61.7 kg)  05/21/20 134 lb (60.8 kg)  05/20/20 134 lb (60.8 kg)    BP 138/78   Pulse 70   Temp 98.1 F (36.7 C) (Oral)   Ht 5\' 1"  (1.549 m)   Wt 136 lb (61.7 kg)   SpO2 96%   BMI 25.70 kg/m   Assessment and Plan: 1. Mixed hyperlipidemia Now on atorvastatin and having no side effects Continue current therapy, check labs - Lipid panel - Comprehensive metabolic panel  2. OA (osteoarthritis) of finger, unspecified laterality Use tylenol and topical agents as needed   Partially dictated using Editor, commissioning. Any errors are unintentional.  Halina Maidens, MD [facility]  09/24/2020

## 2020-09-25 LAB — LIPID PANEL
Chol/HDL Ratio: 3.6 ratio (ref 0.0–4.4)
Cholesterol, Total: 164 mg/dL (ref 100–199)
HDL: 45 mg/dL (ref >39)
LDL Chol Calc (NIH): 91 mg/dL (ref 0–99)
Triglycerides: 162 mg/dL — ABNORMAL HIGH (ref 0–149)
VLDL Cholesterol Cal: 28 mg/dL (ref 5–40)

## 2020-09-25 LAB — COMPREHENSIVE METABOLIC PANEL
ALT: 28 IU/L (ref 0–32)
AST: 20 IU/L (ref 0–40)
Albumin/Globulin Ratio: 1.2 (ref 1.2–2.2)
Albumin: 3.8 g/dL (ref 3.7–4.7)
Alkaline Phosphatase: 63 IU/L (ref 44–121)
BUN/Creatinine Ratio: 22 (ref 12–28)
BUN: 17 mg/dL (ref 8–27)
Bilirubin Total: 0.5 mg/dL (ref 0.0–1.2)
CO2: 24 mmol/L (ref 20–29)
Calcium: 10 mg/dL (ref 8.7–10.3)
Chloride: 105 mmol/L (ref 96–106)
Creatinine, Ser: 0.78 mg/dL (ref 0.57–1.00)
GFR calc Af Amer: 87 mL/min/{1.73_m2} (ref >59)
GFR calc non Af Amer: 75 mL/min/{1.73_m2} (ref >59)
Globulin, Total: 3.1 g/dL (ref 1.5–4.5)
Glucose: 114 mg/dL — ABNORMAL HIGH (ref 65–99)
Potassium: 4.2 mmol/L (ref 3.5–5.2)
Sodium: 142 mmol/L (ref 134–144)
Total Protein: 6.9 g/dL (ref 6.0–8.5)

## 2020-11-12 ENCOUNTER — Other Ambulatory Visit: Payer: Self-pay | Admitting: Internal Medicine

## 2020-11-12 DIAGNOSIS — E785 Hyperlipidemia, unspecified: Secondary | ICD-10-CM

## 2020-11-12 MED ORDER — ATORVASTATIN CALCIUM 10 MG PO TABS: 10.0000 mg | ORAL_TABLET | Freq: Every day | ORAL | 1 refills | Status: DC

## 2020-11-12 NOTE — Telephone Encounter (Signed)
Medication Refill - Medication:  atorvastatin (LIPITOR) 10 MG tablet 90 tablet 1 05/22/2020    Sig - Route: Take 1 tablet (10 mg total) by mouth daily. - Oral   Sent to pharmacy as: atorvastatin (LIPITOR) 10 MG tablet   E-Prescribing Status: Receipt confirmed by pharmacy (05/22/2020 12:10 PM EDT)      Has the patient contacted their pharmacy?yes (Agent: If no, request that the patient contact the pharmacy for the refill.) (Agent: If yes, when and what did the pharmacy advise?) Call office  Preferred Pharmacy (with phone number or street name): [id number] - [place], [place] Phone:  [phone]  Fax:  [phone]       Agent: Please be advised that RX refills may take up to 3 business days. We ask that you follow-up with your pharmacy.

## 2020-12-31 DIAGNOSIS — H44113 Panuveitis, bilateral: Secondary | ICD-10-CM | POA: Diagnosis not present

## 2021-02-21 ENCOUNTER — Other Ambulatory Visit: Payer: Self-pay | Admitting: Internal Medicine

## 2021-02-21 DIAGNOSIS — Z1231 Encounter for screening mammogram for malignant neoplasm of breast: Secondary | ICD-10-CM

## 2021-03-26 ENCOUNTER — Other Ambulatory Visit: Payer: Self-pay

## 2021-03-26 ENCOUNTER — Encounter: Payer: Self-pay | Admitting: Internal Medicine

## 2021-03-26 DIAGNOSIS — Z1211 Encounter for screening for malignant neoplasm of colon: Secondary | ICD-10-CM

## 2021-03-31 ENCOUNTER — Ambulatory Visit
Admission: RE | Admit: 2021-03-31 | Discharge: 2021-03-31 | Disposition: A | Discharge status: Home / Self Care | Payer: Medicare HMO | Source: Ambulatory Visit | Attending: Internal Medicine | Admitting: Internal Medicine

## 2021-03-31 ENCOUNTER — Other Ambulatory Visit: Payer: Self-pay

## 2021-03-31 DIAGNOSIS — Z1231 Encounter for screening mammogram for malignant neoplasm of breast: Secondary | ICD-10-CM | POA: Insufficient documentation

## 2021-04-22 ENCOUNTER — Encounter: Payer: Self-pay | Admitting: *Deleted

## 2021-04-24 ENCOUNTER — Telehealth (INDEPENDENT_AMBULATORY_CARE_PROVIDER_SITE_OTHER): Payer: Self-pay | Admitting: Gastroenterology

## 2021-04-24 DIAGNOSIS — Z1211 Encounter for screening for malignant neoplasm of colon: Secondary | ICD-10-CM

## 2021-04-24 MED ORDER — PEG 3350-KCL-NA BICARB-NACL 420 G PO SOLR: 4000.0000 mL | Freq: Once | ORAL | 0 refills | Status: AC

## 2021-04-24 NOTE — Progress Notes (Signed)
Gastroenterology Pre-Procedure Review  Request Date: 05/13/21 Requesting Physician: Dr. Bonna Gains  PATIENT REVIEW QUESTIONS: The patient responded to the following health history questions as indicated:    1. Are you having any GI issues? no 2. Do you have a personal history of Polyps?  No polyp were removed on 02/24/2011. 3. Do you have a family history of Colon Cancer or Polyps? no 4. Diabetes Mellitus? no 5. Joint replacements in the past 12 months?no 6. Major health problems in the past 3 months?no 7. Any artificial heart valves, MVP, or defibrillator?no    MEDICATIONS & ALLERGIES:    Patient reports the following regarding taking any anticoagulation/antiplatelet therapy:   Plavix, Coumadin, Eliquis, Xarelto, Lovenox, Pradaxa, Brilinta, or Effient? no Aspirin? yes (81 mg)  Patient confirms/reports the following medications:  Current Outpatient Medications  Medication Sig Dispense Refill   Ascorbic Acid (VITAMIN C) 1000 MG tablet Take 1,000 mg by mouth daily.     aspirin 81 MG tablet Take 81 mg by mouth daily.      atorvastatin (LIPITOR) 10 MG tablet Take 1 tablet (10 mg total) by mouth daily. 90 tablet 1   brimonidine-timolol (COMBIGAN) 0.2-0.5 % ophthalmic solution Place 1 drop into both eyes every 12 (twelve) hours.     Calcium Carbonate-Vitamin D 600-400 MG-UNIT tablet Take 1 tablet by mouth daily.     dorzolamide (TRUSOPT) 2 % ophthalmic solution 1 drop 2 (two) times daily.     No current facility-administered medications for this visit.    Patient confirms/reports the following allergies:  No Known Allergies  No orders of the defined types were placed in this encounter.   AUTHORIZATION INFORMATION Primary Insurance: 1D#: Group #:  Secondary Insurance: 1D#: Group #:  SCHEDULE INFORMATION: Date: 05/13/21 Time: Location: [place]

## 2021-05-05 ENCOUNTER — Encounter: Payer: Self-pay | Admitting: Gastroenterology

## 2021-05-13 ENCOUNTER — Ambulatory Visit
Admission: RE | Admit: 2021-05-13 | Discharge: 2021-05-13 | Disposition: A | Discharge status: Home / Self Care | Payer: Medicare HMO | Attending: Gastroenterology | Admitting: Gastroenterology

## 2021-05-13 ENCOUNTER — Other Ambulatory Visit: Payer: Self-pay | Admitting: Internal Medicine

## 2021-05-13 ENCOUNTER — Ambulatory Visit: Payer: Medicare HMO | Admitting: Anesthesiology

## 2021-05-13 ENCOUNTER — Other Ambulatory Visit: Payer: Self-pay

## 2021-05-13 ENCOUNTER — Encounter: Payer: Self-pay | Admitting: Gastroenterology

## 2021-05-13 ENCOUNTER — Encounter
Admission: RE | Disposition: A | Discharge status: Home / Self Care | Payer: Self-pay | Source: Home / Self Care | Attending: Gastroenterology

## 2021-05-13 DIAGNOSIS — F17210 Nicotine dependence, cigarettes, uncomplicated: Secondary | ICD-10-CM | POA: Diagnosis not present

## 2021-05-13 DIAGNOSIS — K635 Polyp of colon: Secondary | ICD-10-CM | POA: Diagnosis not present

## 2021-05-13 DIAGNOSIS — E785 Hyperlipidemia, unspecified: Secondary | ICD-10-CM

## 2021-05-13 DIAGNOSIS — Z79899 Other long term (current) drug therapy: Secondary | ICD-10-CM | POA: Insufficient documentation

## 2021-05-13 DIAGNOSIS — Z8349 Family history of other endocrine, nutritional and metabolic diseases: Secondary | ICD-10-CM | POA: Diagnosis not present

## 2021-05-13 DIAGNOSIS — D126 Benign neoplasm of colon, unspecified: Secondary | ICD-10-CM | POA: Diagnosis not present

## 2021-05-13 DIAGNOSIS — K644 Residual hemorrhoidal skin tags: Secondary | ICD-10-CM | POA: Diagnosis not present

## 2021-05-13 DIAGNOSIS — Z7982 Long term (current) use of aspirin: Secondary | ICD-10-CM | POA: Diagnosis not present

## 2021-05-13 DIAGNOSIS — D123 Benign neoplasm of transverse colon: Secondary | ICD-10-CM | POA: Diagnosis not present

## 2021-05-13 DIAGNOSIS — R69 Illness, unspecified: Secondary | ICD-10-CM | POA: Diagnosis not present

## 2021-05-13 DIAGNOSIS — D124 Benign neoplasm of descending colon: Secondary | ICD-10-CM | POA: Insufficient documentation

## 2021-05-13 DIAGNOSIS — Z1211 Encounter for screening for malignant neoplasm of colon: Secondary | ICD-10-CM

## 2021-05-13 DIAGNOSIS — Z8249 Family history of ischemic heart disease and other diseases of the circulatory system: Secondary | ICD-10-CM | POA: Insufficient documentation

## 2021-05-13 HISTORY — DX: Personal history of transient ischemic attack (TIA), and cerebral infarction without residual deficits: Z86.73

## 2021-05-13 HISTORY — DX: Dizziness and giddiness: R42

## 2021-05-13 HISTORY — PX: COLONOSCOPY WITH PROPOFOL: SHX5780

## 2021-05-13 HISTORY — PX: POLYPECTOMY: SHX5525

## 2021-05-13 SURGERY — COLONOSCOPY WITH PROPOFOL
Anesthesia: Monitor Anesthesia Care | Site: Rectum

## 2021-05-13 MED ORDER — SODIUM CHLORIDE 0.9 % IV SOLN: INTRAVENOUS | Status: DC

## 2021-05-13 MED ORDER — STERILE WATER FOR IRRIGATION IR SOLN
Status: DC | PRN
  Administered 2021-05-13: .05 mL

## 2021-05-13 MED ORDER — PROPOFOL 10 MG/ML IV BOLUS
INTRAVENOUS | Status: DC | PRN
  Administered 2021-05-13: 30 mg via INTRAVENOUS
  Administered 2021-05-13 (×2): 20 mg via INTRAVENOUS
  Administered 2021-05-13: 70 mg via INTRAVENOUS
  Administered 2021-05-13: 20 mg via INTRAVENOUS
  Administered 2021-05-13: 30 mg via INTRAVENOUS
  Administered 2021-05-13: 20 mg via INTRAVENOUS
  Administered 2021-05-13: 10 mg via INTRAVENOUS
  Administered 2021-05-13 (×4): 20 mg via INTRAVENOUS

## 2021-05-13 MED ORDER — LACTATED RINGERS IV SOLN: INTRAVENOUS | Status: DC

## 2021-05-13 MED ORDER — LIDOCAINE HCL (CARDIAC) PF 100 MG/5ML IV SOSY
PREFILLED_SYRINGE | INTRAVENOUS | Status: DC | PRN
  Administered 2021-05-13: 50 mg via INTRAVENOUS

## 2021-05-13 SURGICAL SUPPLY — 9 items
FORCEPS BIOP RAD 4 LRG CAP 4 (CUTTING FORCEPS) ×3 IMPLANT
GOWN CVR UNV OPN BCK APRN NK (MISCELLANEOUS) ×4 IMPLANT
GOWN ISOL THUMB LOOP REG UNIV (MISCELLANEOUS) ×6
KIT PRC NS LF DISP ENDO (KITS) ×2 IMPLANT
KIT PROCEDURE OLYMPUS (KITS) ×3
MANIFOLD NEPTUNE II (INSTRUMENTS) ×3 IMPLANT
NEEDLE CARR LOCKE SCLERO (NEEDLE) ×3 IMPLANT
SNARE LASSO HEX 3 IN 1 (INSTRUMENTS) ×3 IMPLANT
WATER STERILE IRR 250ML POUR (IV SOLUTION) ×3 IMPLANT

## 2021-05-13 NOTE — Op Note (Signed)
[facility] Gastroenterology Patient Name: Erika Collins Procedure Date: 05/13/2021 8:42 AM MRN: [id number] Account #: [id number] Date of Birth: 1946-08-29 Admit Type: Outpatient Age: 75 Room: [facility] OR ROOM 01 Gender: Female Note Status: Finalized Procedure:             Colonoscopy Indications:           Screening for colorectal malignant neoplasm Providers:             Talyia Allende B. Bonna Gains MD, MD Referring MD:          Halina Maidens, MD (Referring MD) Medicines:             Monitored Anesthesia Care Complications:         No immediate complications. Procedure:             Pre-Anesthesia Assessment:                        - ASA Grade Assessment: II - A patient with mild                         systemic disease.                        - Prior to the procedure, a History and Physical was                         performed, and patient medications, allergies and                         sensitivities were reviewed. The patient's tolerance                         of previous anesthesia was reviewed.                        - The risks and benefits of the procedure and the                         sedation options and risks were discussed with the                         patient. All questions were answered and informed                         consent was obtained.                        - Patient identification and proposed procedure were                         verified prior to the procedure by the physician, the                         nurse, the anesthesiologist, the anesthetist and the                         technician. The procedure was verified in the                         procedure room.  After obtaining informed consent, the colonoscope was                         passed under direct vision. Throughout the procedure,                         the patient's blood pressure, pulse, and oxygen                         saturations were monitored  continuously. The                         Colonoscope was introduced through the anus and                         advanced to the the terminal ileum. The colonoscopy                         was performed with ease. The patient tolerated the                         procedure well. The quality of the bowel preparation                         was good. Findings:      The perianal exam findings include non-thrombosed external hemorrhoids.      The appendiceal orifice was noted to be protruding toward the colon       lumen. The mucosa itself appeared normal with white light/NBI scan.       Biopsies were performed around it but not within lumen of the orifice       itself.      A 4 mm polyp was found in the transverse colon. The polyp was flat.       Imaging was performed using white light and narrow band imaging to       visualize the mucosa. The polyp was removed with a cold biopsy forceps.       Resection and retrieval were complete.      A 4 mm polyp was found in the descending colon. The polyp was flat. The       polyp was removed with a cold biopsy forceps. Resection and retrieval       were complete.      The exam was otherwise without abnormality.      The rectum, sigmoid colon, descending colon, transverse colon, ascending       colon, cecum and ileum appeared normal.      A benign appearing was found in the rectum. The scar tissue was healthy       in appearance.      Retroflexion in the rectum was not performed due to Narrow rectum.       Careful frontal view of the rectum was otherwise normal. Impression:            - Non-thrombosed external hemorrhoids found on                         perianal exam.                        The appendiceal orifice was noted to  be protruding                         toward the colon lumen. The mucosa itself appeared                         normal with white light/NBI scan. Biopsies were                         performed around it but not within  lumen of the                         orifice itself.                        - One 4 mm polyp in the transverse colon, removed with                         a cold biopsy forceps. Resected and retrieved.                        - One 4 mm polyp in the descending colon, removed with                         a cold biopsy forceps. Resected and retrieved.                        - The examination was otherwise normal.                        - The rectum, sigmoid colon, descending colon,                         transverse colon, ascending colon, cecum and terminal                         ileum are normal. Recommendation:        - Discharge patient to home (with escort).                        - Advance diet as tolerated.                        - Continue present medications.                        - Await pathology results.                        - Repeat colonoscopy date to be determined after                         pending pathology results are reviewed.                        - The findings and recommendations were discussed with                         the patient.                        -  The findings and recommendations were discussed with                         the patient's family.                        - Return to primary care physician as previously                         scheduled.                        - High fiber diet. Procedure Code(s):     --- Professional ---                        [phone], Colonoscopy, flexible; with biopsy, single or                         multiple Diagnosis Code(s):     --- Professional ---                        Z12.11, Encounter for screening for malignant neoplasm                         of colon                        K63.5, Polyp of colon CPT copyright 2019 American Medical Association. All rights reserved. The codes documented in this report are preliminary and upon coder review may  be revised to meet current compliance requirements.  Vonda Antigua, MD [facility]ret[facility]le B. Bonna Gains MD, MD 05/13/2021 9:30:21 AM This report has been signed electronically. Number of Addenda: 0 Note Initiated On: 05/13/2021 8:42 AM Scope Withdrawal Time: 0 hours 18 minutes 42 seconds  Total Procedure Duration: 0 hours 22 minutes 43 seconds  Estimated Blood Loss:  Estimated blood loss: none.      [facility]

## 2021-05-13 NOTE — Anesthesia Preprocedure Evaluation (Addendum)
Anesthesia Evaluation  Patient identified by MRN, date of birth, ID band Patient awake    Reviewed: Allergy & Precautions, H&P , NPO status , Patient's Chart, lab work & pertinent test results, reviewed documented beta blocker date and time   Airway Mallampati: II  TM Distance: >3 FB Neck ROM: full    Dental no notable dental hx.    Pulmonary Current Smoker and Patient abstained from smoking.,    Pulmonary exam normal breath sounds clear to auscultation       Cardiovascular Exercise Tolerance: Good negative cardio ROS   Rhythm:regular Rate:Normal     Neuro/Psych negative neurological ROS  negative psych ROS   GI/Hepatic negative GI ROS, Neg liver ROS,   Endo/Other  negative endocrine ROS  Renal/GU negative Renal ROS  negative genitourinary   Musculoskeletal   Abdominal   Peds  Hematology negative hematology ROS (+)   Anesthesia Other Findings   Reproductive/Obstetrics negative OB ROS                            Anesthesia Physical Anesthesia Plan  ASA: 2  Anesthesia Plan: General   Post-op Pain Management:    Induction:   PONV Risk Score and Plan:   Airway Management Planned: Natural Airway  Additional Equipment:   Intra-op Plan:   Post-operative Plan:   Informed Consent: I have reviewed the patients History and Physical, chart, labs and discussed the procedure including the risks, benefits and alternatives for the proposed anesthesia with the patient or authorized representative who has indicated his/her understanding and acceptance.     Dental Advisory Given  Plan Discussed with: CRNA and Anesthesiologist  Anesthesia Plan Comments:       Anesthesia Quick Evaluation

## 2021-05-13 NOTE — H&P (Addendum)
Vonda Antigua, MD [address] [place], [place], [place], [address] [address], [place], [place], [place], [address] Phone: [phone]  Fax: [phone]  Primary Care Physician:  Glean Hess, MD   Pre-Procedure History & Physical: HPI:  Erika Collins is a 75 y.o. female is here for a colonoscopy.   Past Medical History:  Diagnosis Date   Arthritis    Glaucoma    Hx of transient ischemic attack (TIA)    over 25 yrs ago.  no deficits.   Osteoarthritis    Vertebral collapse (HCC)    C3,4,5,6,7 compressed over 50 years ago   Vertigo    over 10 yrs ago    Past Surgical History:  Procedure Laterality Date   COLONOSCOPY  02/24/2011   normal, repeat 10 years   TUBAL LIGATION      Prior to Admission medications   Medication Sig Start Date End Date Taking? Authorizing Provider  Ascorbic Acid (VITAMIN C) 1000 MG tablet Take 1,000 mg by mouth daily.   Yes [provider]  aspirin 81 MG tablet Take 81 mg by mouth daily.    Yes [provider]  atorvastatin (LIPITOR) 10 MG tablet TAKE 1 TABLET BY MOUTH EVERY DAY 05/13/21  Yes Glean Hess, MD  brimonidine-timolol (COMBIGAN) 0.2-0.5 % ophthalmic solution Place 1 drop into both eyes every 12 (twelve) hours.   Yes [provider]  Calcium Carbonate-Vitamin D 600-400 MG-UNIT tablet Take 1 tablet by mouth daily.   Yes [provider]  dorzolamide (TRUSOPT) 2 % ophthalmic solution 1 drop 2 (two) times daily. 04/08/20  Yes [provider]  VITAMIN E PO Take by mouth.   Yes [provider]  White Petrolatum-Mineral Oil (ARTIFICIAL TEARS) ointment as needed.   Yes [provider]    Allergies as of 04/24/2021   (No Known Allergies)    Family History  Problem Relation Age of Onset   Heart attack Father    Hyperlipidemia Sister    Breast cancer Neg Hx     Social History   Socioeconomic History   Marital status: Widowed    Spouse name: Not on file    Number of children: 1   Years of education: some college   Highest education level: 12th grade  Occupational History   Occupation: Retired  Tobacco Use   Smoking status: Every Day    Packs/day: 0.25    Years: 45.00    Pack years: 11.25    Types: Cigarettes   Smokeless tobacco: Never   Tobacco comments:    3-4 cigerettes daily  Vaping Use   Vaping Use: Never used  Substance and Sexual Activity   Alcohol use: Yes    Alcohol/week: 7.0 - 10.0 standard drinks    Types: 7 - 10 Glasses of wine per week   Drug use: Never   Sexual activity: Not Currently  Other Topics Concern   Not on file  Social History Narrative   Pt lives alone.    Social Determinants of Health   Financial Resource Strain: Low Risk    Difficulty of Paying Living Expenses: Not hard at all  Food Insecurity: No Food Insecurity   Worried About Charity fundraiser in the Last Year: Never true   [place] in the Last Year: Never true  Transportation Needs: No Transportation Needs   Lack of Transportation (Medical): No   Lack of Transportation (Non-Medical): No  Physical Activity: Sufficiently Active   Days of  Exercise per Week: 7 days   Minutes of Exercise per Session: 40 min  Stress: No Stress Concern Present   Feeling of Stress : Only a little  Social Connections: Socially Isolated   Frequency of Communication with Friends and Family: More than three times a week   Frequency of Social Gatherings with Friends and Family: Twice a week   Attends Religious Services: Never   Marine scientist or Organizations: No   Attends Archivist Meetings: Never   Marital Status: Widowed  Human resources officer Violence: Not At Risk   Fear of Current or Ex-Partner: No   Emotionally Abused: No   Physically Abused: No   Sexually Abused: No    Review of Systems: See HPI, otherwise negative ROS  Physical Exam: Constitutional: General:   Alert,  Well-developed, well-nourished, pleasant and cooperative  in NAD BP (!) 166/64   Pulse 71   Temp (!) 97 F (36.1 C) (Temporal)   Ht '5\' 1"'$  (1.549 m)   Wt 59.4 kg   SpO2 98%   BMI 24.75 kg/m   Head: Normocephalic, atraumatic.   Eyes:  Sclera clear, no icterus.   Conjunctiva pink.   Mouth:  No deformity or lesions, oropharynx pink & moist.  Neck:  Supple, trachea midline  Respiratory: Normal respiratory effort  Gastrointestinal:  Soft, non-tender and non-distended without masses, hepatosplenomegaly or hernias noted.  No guarding or rebound tenderness.     Cardiac: No clubbing or edema.  No cyanosis. Normal posterior tibial pedal pulses noted.  Lymphatic:  No significant cervical adenopathy.  Psych:  Alert and cooperative. Normal mood and affect.  Musculoskeletal:   Symmetrical without gross deformities. 5/5 Lower extremity strength bilaterally.  Skin: Warm. Intact without significant lesions or rashes. No jaundice.  Neurologic:  Face symmetrical, tongue midline, Normal sensation to touch;  grossly normal neurologically.  Psych:  Alert and oriented x3, Alert and cooperative. Normal mood and affect.  Impression/Plan: Erika Collins is here for a colonoscopy to be performed for average risk screening. Pt reports 3 colonoscopies over her lifetime.  States the first 1 had 5 benign polyps.  Since then no other polyps were found on future colonoscopies.  Most recent colonoscopy record from 2012 did not show any polyps and is available in the chart.  Any other previous colonoscopies or pathology reports are not available in her chart.  States in the past she has been advised to get a colonoscopy every 10 years.  Risks, benefits, limitations, and alternatives regarding  colonoscopy have been reviewed with the patient.  Questions have been answered.  All parties agreeable.   Virgel Manifold, MD  05/13/2021, 8:30 AM

## 2021-05-13 NOTE — Anesthesia Procedure Notes (Signed)
Date/Time: 05/13/2021 8:55 AM Performed by: Mayme Genta, CRNA Pre-anesthesia Checklist: Patient identified, Emergency Drugs available, Suction available, Timeout performed and Patient being monitored Patient Re-evaluated:Patient Re-evaluated prior to induction Oxygen Delivery Method: Nasal cannula Placement Confirmation: positive ETCO2

## 2021-05-13 NOTE — Transfer of Care (Signed)
Immediate Anesthesia Transfer of Care Note  Patient: Erika Collins  Procedure(s) Performed: COLONOSCOPY WITH PROPOFOL (Rectum) POLYPECTOMY (Rectum)  Patient Location: PACU  Anesthesia Type: MAC  Level of Consciousness: awake, alert  and patient cooperative  Airway and Oxygen Therapy: Patient Spontanous Breathing and Patient connected to supplemental oxygen  Post-op Assessment: Post-op Vital signs reviewed, Patient's Cardiovascular Status Stable, Respiratory Function Stable, Patent Airway and No signs of Nausea or vomiting  Post-op Vital Signs: Reviewed and stable  Complications: No notable events documented.

## 2021-05-13 NOTE — Anesthesia Postprocedure Evaluation (Signed)
Anesthesia Post Note  Patient: Erika Collins  Procedure(s) Performed: COLONOSCOPY WITH PROPOFOL (Rectum) POLYPECTOMY (Rectum)     Patient location during evaluation: PACU Anesthesia Type: General Level of consciousness: awake and alert Pain management: pain level controlled Vital Signs Assessment: post-procedure vital signs reviewed and stable Respiratory status: spontaneous breathing, nonlabored ventilation, respiratory function stable and patient connected to nasal cannula oxygen Cardiovascular status: blood pressure returned to baseline and stable Postop Assessment: no apparent nausea or vomiting Anesthetic complications: no   No notable events documented.  Trecia Rogers

## 2021-05-14 ENCOUNTER — Encounter: Payer: Self-pay | Admitting: Gastroenterology

## 2021-05-14 LAB — SURGICAL PATHOLOGY

## 2021-05-19 ENCOUNTER — Encounter: Payer: Self-pay | Admitting: Gastroenterology

## 2021-05-21 ENCOUNTER — Other Ambulatory Visit: Payer: Self-pay

## 2021-05-21 ENCOUNTER — Ambulatory Visit (INDEPENDENT_AMBULATORY_CARE_PROVIDER_SITE_OTHER): Payer: Medicare HMO

## 2021-05-21 VITALS — BP 108/66 | HR 62 | Temp 98.3°F | Resp 16 | Ht 61.0 in | Wt 131.6 lb

## 2021-05-21 DIAGNOSIS — Z0000 Encounter for general adult medical examination without abnormal findings: Secondary | ICD-10-CM | POA: Diagnosis not present

## 2021-05-21 NOTE — Progress Notes (Signed)
Subjective:   Erika Collins is a 75 y.o. female who presents for Medicare Annual (Subsequent) preventive examination.  Review of Systems     Cardiac Risk Factors include: advanced age (>72mn, >>57women);dyslipidemia     Objective:    Today's Vitals   05/21/21 1126 05/21/21 1129  BP: 108/66   Pulse: 62   Resp: 16   Temp: 98.3 F (36.8 C)   TempSrc: Oral   SpO2: 99%   Weight: 131 lb 9.6 oz (59.7 kg)   Height: '5\' 1"'$  (1.549 m)   PainSc:  3    Body mass index is 24.87 kg/m.  Advanced Directives 05/21/2021 05/13/2021 05/17/2019 05/11/2018  Does Patient Have a Medical Advance Directive? Yes Yes Yes No  Type of [place]of [place] will [place] will [place] will -  Does patient want to make changes to medical advance directive? - No - Patient declined - -  Copy of [place] Chart? Yes - validated most recent copy scanned in chart (See row information) Yes - validated most recent copy scanned in chart (See row information) Yes - validated most recent copy scanned in chart (See row information) -  Would patient like information on creating a medical advance directive? - - - Yes (MAU/Ambulatory/Procedural Areas - Information given)    Current Medications (verified) Outpatient Encounter Medications as of 05/21/2021  Medication Sig   Ascorbic Acid (VITAMIN C) 1000 MG tablet Take 1,000 mg by mouth daily.   aspirin 81 MG tablet Take 81 mg by mouth daily.    atorvastatin (LIPITOR) 10 MG tablet TAKE 1 TABLET BY MOUTH EVERY DAY   brimonidine-timolol (COMBIGAN) 0.2-0.5 % ophthalmic solution Place 1 drop into both eyes every 12 (twelve) hours.   Calcium Carbonate-Vitamin D 600-400 MG-UNIT tablet Take 1 tablet by mouth daily.   Carboxymethylcellulose Sod PF 1 % GEL    Omega-3 Fatty Acids (FISH OIL) 1000 MG CAPS    VITAMIN E PO Take by mouth.   [DISCONTINUED] dorzolamide (TRUSOPT) 2 %  ophthalmic solution 1 drop 2 (two) times daily.   [DISCONTINUED] White Petrolatum-Mineral Oil (ARTIFICIAL TEARS) ointment as needed.   No facility-administered encounter medications on file as of 05/21/2021.    Allergies (verified) Patient has no known allergies.   History: Past Medical History:  Diagnosis Date   Allergy    Arthritis    Glaucoma    Hx of transient ischemic attack (TIA)    over 25 yrs ago.  no deficits.   Osteoarthritis    Vertebral collapse (HCC)    C3,4,5,6,7 compressed over 50 years ago   Vertigo    over 10 yrs ago   Past Surgical History:  Procedure Laterality Date   COLONOSCOPY  02/24/2011   normal, repeat 10 years   COLONOSCOPY WITH PROPOFOL N/A 05/13/2021   Procedure: COLONOSCOPY WITH PROPOFOL;  Surgeon: TVirgel Manifold MD;  [facility]  Service: Endoscopy;  Laterality: N/A;   EYE SURGERY  Medicated Implants Both Eyes   Retina Specialist   POLYPECTOMY  05/13/2021   Procedure: POLYPECTOMY;  Surgeon: TVirgel Manifold MD;  [facility] [facility]  Service: Endoscopy;;   TUBAL LIGATION     Family History  Problem Relation Age of Onset   Heart attack Father    Hyperlipidemia Sister    Breast cancer Neg Hx    Social History   Socioeconomic History   Marital status: Widowed  Spouse name: Not on file   Number of children: 1   Years of education: some college   Highest education level: 12th grade  Occupational History   Occupation: Retired  Tobacco Use   Smoking status: Every Day    Packs/day: 0.25    Years: 45.00    Pack years: 11.25    Types: Cigarettes   Smokeless tobacco: Never   Tobacco comments:    3-4 cigerettes daily  Vaping Use   Vaping Use: Never used  Substance and Sexual Activity   Alcohol use: Yes    Alcohol/week: 7.0 - 10.0 standard drinks    Types: 7 - 10 Glasses of wine per week   Drug use: Never   Sexual activity: Not Currently  Other Topics Concern   Not on file  Social History  Narrative   Pt lives alone.    Social Determinants of Health   Financial Resource Strain: Low Risk    Difficulty of Paying Living Expenses: Not hard at all  Food Insecurity: No Food Insecurity   Worried About Charity fundraiser in the Last Year: Never true   [place] in the Last Year: Never true  Transportation Needs: No Transportation Needs   Lack of Transportation (Medical): No   Lack of Transportation (Non-Medical): No  Physical Activity: Sufficiently Active   Days of Exercise per Week: 7 days   Minutes of Exercise per Session: 30 min  Stress: No Stress Concern Present   Feeling of Stress : Not at all  Social Connections: Socially Isolated   Frequency of Communication with Friends and Family: More than three times a week   Frequency of Social Gatherings with Friends and Family: Twice a week   Attends Religious Services: Never   Marine scientist or Organizations: No   Attends Archivist Meetings: Never   Marital Status: Widowed    Tobacco Counseling Ready to quit: No Counseling given: Not Answered Tobacco comments: 3-4 cigerettes daily   Clinical Intake:  Pre-visit preparation completed: Yes  Pain : 0-10 Pain Score: 3  Pain Type: Chronic pain Pain Location: Back Pain Orientation: Lower Pain Descriptors / Indicators: Aching Pain Onset: More than a month ago Pain Frequency: Constant     BMI - recorded: 24.87 Nutritional Status: BMI of 19-24  Normal Nutritional Risks: None Diabetes: No  How often do you need to have someone help you when you read instructions, pamphlets, or other written materials from your doctor or pharmacy?: 1 - Never    Interpreter Needed?: No  Information entered by :: Clemetine Marker LPN   Activities of Daily Living In your present state of health, do you have any difficulty performing the following activities: 05/21/2021 05/13/2021  Hearing? N N  Vision? N N  Difficulty concentrating or making decisions? N N   Walking or climbing stairs? N N  Dressing or bathing? N N  Doing errands, shopping? N -  Preparing Food and eating ? N -  Using the Toilet? N -  In the past six months, have you accidently leaked urine? N -  Do you have problems with loss of bowel control? N -  Managing your Medications? N -  Managing your Finances? N -  Housekeeping or managing your Housekeeping? N -  Some recent data might be hidden    Patient Care Team: Glean Hess, MD as PCP - General (Internal Medicine) Karren Burly Deirdre Peer, MD as Consulting Physician (Ophthalmology) Dr. Kallie Locks as Consulting  Physician ([facility]) Virgel Manifold, MD as Consulting Physician (Gastroenterology)  Indicate any recent Medical Services you may have received from other than [facility] providers in the past year (date may be approximate).     Assessment:   This is a routine wellness examination for Erika Collins.  Hearing/Vision screen Hearing Screening - Comments:: Pt denies hearing difficulty Vision Screening - Comments:: Annual vision screenings with Dr. Jeannett [facility] at [facility] in [place] issues and exercise activities discussed: Current Exercise Habits: Home exercise routine, Type of exercise: treadmill;walking;strength training/weights, Time (Minutes): 30, Frequency (Times/Week): 7, Weekly Exercise (Minutes/Week): 210, Intensity: Moderate, Exercise limited by: None identified   Goals Addressed   None    Depression Screen PHQ 2/9 Scores 05/21/2021 09/24/2020 05/20/2020 10/11/2019 05/17/2019 05/17/2018 05/11/2018  PHQ - 2 Score 0 0 0 0 1 0 0  PHQ- 9 Score 0 1 - - 2 0 0    Fall Risk Fall Risk  05/21/2021 09/24/2020 05/20/2020 10/11/2019 05/17/2019  Falls in the past year? 1 0 0 0 1  Number falls in past yr: 0 - 0 0 1  Injury with Fall? 0 - 0 0 0  Risk for fall due to : No Fall Risks - Impaired vision - Impaired vision  Risk for fall due to: Comment - - - - -  Follow up Falls prevention discussed Falls  evaluation completed Falls prevention discussed Falls evaluation completed Falls prevention discussed    FALL RISK PREVENTION PERTAINING TO THE HOME:  Any stairs in or around the home? No  If so, are there any without handrails? No  Home free of loose throw rugs in walkways, pet beds, electrical cords, etc? Yes  Adequate lighting in your home to reduce risk of falls? Yes   ASSISTIVE DEVICES UTILIZED TO PREVENT FALLS:  Life alert? No  Use of a cane, walker or w/c? No  Grab bars in the bathroom? Yes  Shower chair or bench in shower? Yes  Elevated toilet seat or a handicapped toilet? No   TIMED UP AND GO:  Was the test performed? Yes .  Length of time to ambulate 10 feet: 5 sec.   Gait steady and fast without use of assistive device  Cognitive Function: Normal cognitive status assessed by direct observation by this Nurse Health Advisor. No abnormalities found.       6CIT Screen 05/20/2020 05/17/2019 05/11/2018  What Year? 0 points 0 points 0 points  What month? 0 points 0 points 0 points  What time? 0 points 0 points 0 points  Count back from 20 0 points 0 points 0 points  Months in reverse 0 points 0 points 0 points  Repeat phrase 0 points 2 points 0 points  Total Score 0 2 0    Immunizations Immunization History  Administered Date(s) Administered   PFIZER(Purple Top)SARS-COV-2 Vaccination 11/10/2019, 12/01/2019, 08/12/2020   Pneumococcal Conjugate-13 05/19/2019   Pneumococcal Polysaccharide-23 05/17/2018   Tdap 01/17/2020    TDAP status: Up to date  Flu Vaccine status: Declined, Education has been provided regarding the importance of this vaccine but patient still declined. Advised may receive this vaccine at local pharmacy or Health Dept. Aware to provide a copy of the vaccination record if obtained from local pharmacy or Health Dept. Verbalized acceptance and understanding.  Pneumococcal vaccine status: Up to date  Covid-19 vaccine status: Completed  vaccines  Qualifies for Shingles Vaccine? Yes   Zostavax completed No   Shingrix Completed?: No.    Education has been provided  regarding the importance of this vaccine. Patient has been advised to call insurance company to determine out of pocket expense if they have not yet received this vaccine. Advised may also receive vaccine at local pharmacy or Health Dept. Verbalized acceptance and understanding.  Screening Tests Health Maintenance  Topic Date Due   Zoster Vaccines- Shingrix (1 of 2) Never done   COVID-19 Vaccine (4 - Booster for Pfizer series) 12/10/2020   INFLUENZA VACCINE  04/21/2021   MAMMOGRAM  03/31/2022   TETANUS/TDAP  01/16/2030   COLONOSCOPY (Pts 45-32yr Insurance coverage will need to be confirmed)  05/14/2031   DEXA SCAN  Completed   Hepatitis C Screening  Completed   PNA vac Low Risk Adult  Completed   HPV VACCINES  Aged Out    Health Maintenance  Health Maintenance Due  Topic Date Due   Zoster Vaccines- Shingrix (1 of 2) Never done   COVID-19 Vaccine (4 - Booster for Pfizer series) 12/10/2020   INFLUENZA VACCINE  04/21/2021    Colorectal cancer screening: Type of screening: Colonoscopy. Completed 05/13/21. Repeat every as directed years  Mammogram status: Completed 03/31/21. Repeat every year  Bone Density status: Completed 06/10/20. Results reflect: Bone density results: OSTEOPENIA. Repeat every 2 years.  Lung Cancer Screening: (Low Dose CT Chest recommended if Age 406-80years, 30 pack-year currently smoking OR have quit w/in 15years.) does not qualify.   Additional Screening:  Hepatitis C Screening: does qualify; Completed 05/21/20  Vision Screening: Recommended annual ophthalmology exams for early detection of glaucoma and other disorders of the eye. Is the patient up to date with their annual eye exam?  Yes  Who is the provider or what is the name of the office in which the patient attends annual eye exams? Dr. PJeannett Senior   Dental Screening: Recommended  annual dental exams for proper oral hygiene  Community Resource Referral / Chronic Care Management: CRR required this visit?  No   CCM required this visit?  No      Plan:     I have personally reviewed and noted the following in the patient's chart:   Medical and social history Use of alcohol, tobacco or illicit drugs  Current medications and supplements including opioid prescriptions.  Functional ability and status Nutritional status Physical activity Advanced directives List of other physicians Hospitalizations, surgeries, and ER visits in previous 12 months Vitals Screenings to include cognitive, depression, and falls Referrals and appointments  In addition, I have reviewed and discussed with patient certain preventive protocols, quality metrics, and best practice recommendations. A written personalized care plan for preventive services as well as general preventive health recommendations were provided to patient.     KClemetine Marker LPN   [id number]  Nurse Notes: pt c/o intermittent tingling in right arm from fingers to shoulder within the last 2-3 months; pt notices it more in the afternoon/evening. Pt plans to discuss at CPE on 05/28/21.

## 2021-05-21 NOTE — Patient Instructions (Signed)
Erika Collins , Thank you for taking time to come for your Medicare Wellness Visit. I appreciate your ongoing commitment to your health goals. Please review the following plan we discussed and let me know if I can assist you in the future.   Screening recommendations/referrals: Colonoscopy: done 05/13/21; repeat at directed by gastroenterology Mammogram: done 03/31/21 Bone Density: done 06/10/20 Recommended yearly ophthalmology/optometry visit for glaucoma screening and checkup Recommended yearly dental visit for hygiene and checkup  Vaccinations: Influenza vaccine: declined Pneumococcal vaccine: done 05/19/19 Tdap vaccine: done 01/17/20 Shingles vaccine: Shingrix discussed. Please contact your pharmacy for coverage information.  Covid-19:done 11/10/19, 12/01/19 & 08/12/20  Conditions/risks identified: Keep up the great work!  Next appointment: Follow up in one year for your annual wellness visit    Preventive Care 65 Years and Older, Female Preventive care refers to lifestyle choices and visits with your health care provider that can promote health and wellness. What does preventive care include? A yearly physical exam. This is also called an annual well check. Dental exams once or twice a year. Routine eye exams. Ask your health care provider how often you should have your eyes checked. Personal lifestyle choices, including: Daily care of your teeth and gums. Regular physical activity. Eating a healthy diet. Avoiding tobacco and drug use. Limiting alcohol use. Practicing safe sex. Taking low-dose aspirin every day. Taking vitamin and mineral supplements as recommended by your health care provider. What happens during an annual well check? The services and screenings done by your health care provider during your annual well check will depend on your age, overall health, lifestyle risk factors, and family history of disease. Counseling  Your health care provider may ask you questions  about your: Alcohol use. Tobacco use. Drug use. Emotional well-being. Home and relationship well-being. Sexual activity. Eating habits. History of falls. Memory and ability to understand (cognition). Work and work Statistician. Reproductive health. Screening  You may have the following tests or measurements: Height, weight, and BMI. Blood pressure. Lipid and cholesterol levels. These may be checked every 5 years, or more frequently if you are over 31 years old. Skin check. Lung cancer screening. You may have this screening every year starting at age 17 if you have a 30-pack-year history of smoking and currently smoke or have quit within the past 15 years. Fecal occult blood test (FOBT) of the stool. You may have this test every year starting at age 71. Flexible sigmoidoscopy or colonoscopy. You may have a sigmoidoscopy every 5 years or a colonoscopy every 10 years starting at age 74. Hepatitis C blood test. Hepatitis B blood test. Sexually transmitted disease (STD) testing. Diabetes screening. This is done by checking your blood sugar (glucose) after you have not eaten for a while (fasting). You may have this done every 1-3 years. Bone density scan. This is done to screen for osteoporosis. You may have this done starting at age 49. Mammogram. This may be done every 1-2 years. Talk to your health care provider about how often you should have regular mammograms. Talk with your health care provider about your test results, treatment options, and if necessary, the need for more tests. Vaccines  Your health care provider may recommend certain vaccines, such as: Influenza vaccine. This is recommended every year. Tetanus, diphtheria, and acellular pertussis (Tdap, Td) vaccine. You may need a Td booster every 10 years. Zoster vaccine. You may need this after age 67. Pneumococcal 13-valent conjugate (PCV13) vaccine. One dose is recommended after age 61. Pneumococcal polysaccharide (  PPSV23)  vaccine. One dose is recommended after age 32. Talk to your health care provider about which screenings and vaccines you need and how often you need them. This information is not intended to replace advice given to you by your health care provider. Make sure you discuss any questions you have with your health care provider. Document Released: 10/04/2015 Document Revised: 05/27/2016 Document Reviewed: 07/09/2015 Elsevier Interactive Patient Education  2017 [employer] Prevention in the Home Falls can cause injuries. They can happen to people of all ages. There are many things you can do to make your home safe and to help prevent falls. What can I do on the outside of my home? Regularly fix the edges of walkways and driveways and fix any cracks. Remove anything that might make you trip as you walk through a door, such as a raised step or threshold. Trim any bushes or trees on the path to your home. Use bright outdoor lighting. Clear any walking paths of anything that might make someone trip, such as rocks or tools. Regularly check to see if handrails are loose or broken. Make sure that both sides of any steps have handrails. Any raised decks and porches should have guardrails on the edges. Have any leaves, snow, or ice cleared regularly. Use sand or salt on walking paths during winter. Clean up any spills in your garage right away. This includes oil or grease spills. What can I do in the bathroom? Use night lights. Install grab bars by the toilet and in the tub and shower. Do not use towel bars as grab bars. Use non-skid mats or decals in the tub or shower. If you need to sit down in the shower, use a plastic, non-slip stool. Keep the floor dry. Clean up any water that spills on the floor as soon as it happens. Remove soap buildup in the tub or shower regularly. Attach bath mats securely with double-sided non-slip rug tape. Do not have throw rugs and other things on the floor that  can make you trip. What can I do in the bedroom? Use night lights. Make sure that you have a light by your bed that is easy to reach. Do not use any sheets or blankets that are too big for your bed. They should not hang down onto the floor. Have a firm chair that has side arms. You can use this for support while you get dressed. Do not have throw rugs and other things on the floor that can make you trip. What can I do in the kitchen? Clean up any spills right away. Avoid walking on wet floors. Keep items that you use a lot in easy-to-reach places. If you need to reach something above you, use a strong step stool that has a grab bar. Keep electrical cords out of the way. Do not use floor polish or wax that makes floors slippery. If you must use wax, use non-skid floor wax. Do not have throw rugs and other things on the floor that can make you trip. What can I do with my stairs? Do not leave any items on the stairs. Make sure that there are handrails on both sides of the stairs and use them. Fix handrails that are broken or loose. Make sure that handrails are as long as the stairways. Check any carpeting to make sure that it is firmly attached to the stairs. Fix any carpet that is loose or worn. Avoid having throw rugs at the top or  bottom of the stairs. If you do have throw rugs, attach them to the floor with carpet tape. Make sure that you have a light switch at the top of the stairs and the bottom of the stairs. If you do not have them, ask someone to add them for you. What else can I do to help prevent falls? Wear shoes that: Do not have high heels. Have rubber bottoms. Are comfortable and fit you well. Are closed at the toe. Do not wear sandals. If you use a stepladder: Make sure that it is fully opened. Do not climb a closed stepladder. Make sure that both sides of the stepladder are locked into place. Ask someone to hold it for you, if possible. Clearly mark and make sure that you  can see: Any grab bars or handrails. First and last steps. Where the edge of each step is. Use tools that help you move around (mobility aids) if they are needed. These include: Canes. Walkers. Scooters. Crutches. Turn on the lights when you go into a dark area. Replace any light bulbs as soon as they burn out. Set up your furniture so you have a clear path. Avoid moving your furniture around. If any of your floors are uneven, fix them. If there are any pets around you, be aware of where they are. Review your medicines with your doctor. Some medicines can make you feel dizzy. This can increase your chance of falling. Ask your doctor what other things that you can do to help prevent falls. This information is not intended to replace advice given to you by your health care provider. Make sure you discuss any questions you have with your health care provider. Document Released: 07/04/2009 Document Revised: 02/13/2016 Document Reviewed: 10/12/2014 Elsevier Interactive Patient Education  2017 [employer].

## 2021-05-23 ENCOUNTER — Telehealth: Payer: Self-pay

## 2021-05-23 NOTE — Telephone Encounter (Signed)
Left message on voicemail requesting pt return my call in reference to scheduling CT Scan and wanting to know if pt has a preference on a specific day of the week

## 2021-05-27 ENCOUNTER — Telehealth: Payer: Self-pay

## 2021-05-27 ENCOUNTER — Other Ambulatory Visit: Payer: Self-pay | Admitting: Gastroenterology

## 2021-05-27 DIAGNOSIS — K388 Other specified diseases of appendix: Secondary | ICD-10-CM

## 2021-05-27 NOTE — Telephone Encounter (Signed)
Ct Abd Pelvis w/ contrast scheduled for September 15th, arrive at 12:45pm and have nothing to eat or drink 4 hours prior to scan. Patient must pick up prep 1-2 days prior to scheduled CT scan....   Pt is aware as instructed

## 2021-05-28 ENCOUNTER — Encounter: Payer: Self-pay | Admitting: Internal Medicine

## 2021-05-28 ENCOUNTER — Ambulatory Visit (INDEPENDENT_AMBULATORY_CARE_PROVIDER_SITE_OTHER): Payer: Medicare HMO | Admitting: Internal Medicine

## 2021-05-28 ENCOUNTER — Other Ambulatory Visit: Payer: Self-pay

## 2021-05-28 VITALS — BP 128/68 | HR 59 | Temp 98.1°F | Ht 61.0 in | Wt 130.0 lb

## 2021-05-28 DIAGNOSIS — R351 Nocturia: Secondary | ICD-10-CM | POA: Diagnosis not present

## 2021-05-28 DIAGNOSIS — Z8673 Personal history of transient ischemic attack (TIA), and cerebral infarction without residual deficits: Secondary | ICD-10-CM | POA: Diagnosis not present

## 2021-05-28 DIAGNOSIS — E782 Mixed hyperlipidemia: Secondary | ICD-10-CM

## 2021-05-28 DIAGNOSIS — E559 Vitamin D deficiency, unspecified: Secondary | ICD-10-CM | POA: Diagnosis not present

## 2021-05-28 DIAGNOSIS — Z720 Tobacco use: Secondary | ICD-10-CM | POA: Diagnosis not present

## 2021-05-28 DIAGNOSIS — Z0000 Encounter for general adult medical examination without abnormal findings: Secondary | ICD-10-CM | POA: Diagnosis not present

## 2021-05-28 DIAGNOSIS — R7309 Other abnormal glucose: Secondary | ICD-10-CM | POA: Diagnosis not present

## 2021-05-28 LAB — POC URINALYSIS WITH MICROSCOPIC (NON AUTO)MANUAL RESULT
Bilirubin, UA: NEGATIVE
Glucose, UA: NEGATIVE
Ketones, UA: NEGATIVE
Mucus, UA: 0
Nitrite, UA: NEGATIVE
Protein, UA: NEGATIVE
RBC: 0 M/uL — AB (ref 4.04–5.48)
Spec Grav, UA: 1.015 (ref 1.010–1.025)
Urobilinogen, UA: 0.2 E.U./dL
pH, UA: 6.5 (ref 5.0–8.0)

## 2021-05-28 MED ORDER — SULFAMETHOXAZOLE-TRIMETHOPRIM 800-160 MG PO TABS: 1.0000 | ORAL_TABLET | Freq: Two times a day (BID) | ORAL | 0 refills | Status: AC

## 2021-05-28 NOTE — Progress Notes (Signed)
Date:  05/28/2021   Name:  Erika Collins   DOB:  04-24-1946   MRN:  [id number]   Chief Complaint: Annual Exam (Breast exam no pap) Erika Collins is a 75 y.o. female who presents today for her Complete Annual Exam. She feels well. She reports exercising walking x 7 days a week and gym 2-3 days a week. She reports she is sleeping well. Breast complaints none.  Mammogram: 03/2021 DEXA: 05/2020 Osteopenia Pap smear: discontinued Colonoscopy: 04/2021 path: DIAGNOSIS:  A. COLON, APPENDICEAL ORIFICE; BIOPSY:  - COLONIC MUCOSA WITH FOCAL PROMINENT LYMPHOID AGGREGATE.  - NEGATIVE FOR DYSPLASIA AND MALIGNANCY.  CT scheduled.  Immunization History  Administered Date(s) Administered   PFIZER(Purple Top)SARS-COV-2 Vaccination 11/10/2019, 12/01/2019, 08/12/2020   Pneumococcal Conjugate-13 05/19/2019   Pneumococcal Polysaccharide-23 05/17/2018   Tdap 01/17/2020    Hyperlipidemia This is a chronic problem. The problem is controlled. Pertinent negatives include no chest pain or shortness of breath. Current antihyperlipidemic treatment includes statins. The current treatment provides significant improvement of lipids. There are no compliance problems.   Urinary Frequency  This is a new problem. The current episode started in the past 7 days. The problem occurs intermittently. The patient is experiencing no pain. There has been no fever. Associated symptoms include frequency. Pertinent negatives include no chills, discharge, hematuria, hesitancy, nausea or vomiting. She has tried nothing for the symptoms.   Lab Results  Component Value Date   CREATININE 0.78 09/24/2020   BUN 17 09/24/2020   NA 142 09/24/2020   K 4.2 09/24/2020   CL 105 09/24/2020   CO2 24 09/24/2020   Lab Results  Component Value Date   CHOL 164 09/24/2020   HDL 45 09/24/2020   LDLCALC 91 09/24/2020   TRIG 162 (H) 09/24/2020   CHOLHDL 3.6 09/24/2020   Lab Results  Component Value Date   TSH 1.160 05/19/2019   Lab Results   Component Value Date   HGBA1C 5.9 (H) 05/23/2020   Lab Results  Component Value Date   WBC 4.9 05/21/2020   HGB 13.2 05/21/2020   HCT 39.1 05/21/2020   MCV 96.1 05/21/2020   PLT 149 (L) 05/21/2020   Lab Results  Component Value Date   ALT 28 09/24/2020   AST 20 09/24/2020   ALKPHOS 63 09/24/2020   BILITOT 0.5 09/24/2020  Last vitamin D Lab Results  Component Value Date   VD25OH 32.52 05/21/2020      Review of Systems  Constitutional:  Negative for chills, fatigue and fever.  HENT:  Negative for congestion, hearing loss, tinnitus, trouble swallowing and voice change.   Eyes:  Negative for visual disturbance.  Respiratory:  Negative for cough, chest tightness, shortness of breath and wheezing.   Cardiovascular:  Negative for chest pain, palpitations and leg swelling.  Gastrointestinal:  Negative for abdominal pain, constipation, diarrhea, nausea and vomiting.  Endocrine: Negative for polydipsia and polyuria.  Genitourinary:  Positive for frequency. Negative for dysuria, genital sores, hematuria, hesitancy, vaginal bleeding and vaginal discharge.  Musculoskeletal:  Negative for arthralgias, gait problem and joint swelling.  Skin:  Negative for color change and rash.  Neurological:  Negative for dizziness, tremors, light-headedness and headaches.  Hematological:  Negative for adenopathy. Does not bruise/bleed easily.  Psychiatric/Behavioral:  Negative for dysphoric mood and sleep disturbance. The patient is not nervous/anxious.    Patient Active Problem List   Diagnosis Date Noted   Polyp of transverse colon    Chronic bilateral low back pain with left-sided sciatica 10/11/2019  OA (osteoarthritis) of finger, unspecified laterality 05/19/2019   Numbness and tingling of foot 05/19/2019   Cervical disc disorder with radiculopathy 05/17/2018   History of TIA (transient ischemic attack) 05/17/2018   Vitamin D deficiency 05/17/2018   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    Mixed hyperlipidemia 12/13/2017   Tobacco use 12/13/2017   Primary open angle glaucoma 01/13/2013    No Known Allergies  Past Surgical History:  Procedure Laterality Date   COLONOSCOPY  02/24/2011   normal, repeat 10 years   COLONOSCOPY WITH PROPOFOL N/A 05/13/2021   Procedure: COLONOSCOPY WITH PROPOFOL;  Surgeon: Virgel Manifold, MD;  Location: [facility];  Service: Endoscopy;  Laterality: N/A;   EYE SURGERY  Medicated Implants Both Eyes   Retina Specialist   POLYPECTOMY  05/13/2021   Procedure: POLYPECTOMY;  Surgeon: Virgel Manifold, MD;  Location: [facility];  Service: Endoscopy;;   TUBAL LIGATION      Social History   Tobacco Use   Smoking status: Every Day    Packs/day: 0.25    Years: 45.00    Pack years: 11.25    Types: Cigarettes   Smokeless tobacco: Never   Tobacco comments:    3-4 cigerettes daily  Vaping Use   Vaping Use: Never used  Substance Use Topics   Alcohol use: Yes    Alcohol/week: 7.0 - 10.0 standard drinks    Types: 7 - 10 Glasses of wine per week   Drug use: Never     Medication list has been reviewed and updated.  Current Meds  Medication Sig   Ascorbic Acid (VITAMIN C) 1000 MG tablet Take 1,000 mg by mouth daily.   aspirin 81 MG tablet Take 81 mg by mouth daily.    atorvastatin (LIPITOR) 10 MG tablet TAKE 1 TABLET BY MOUTH EVERY DAY   brimonidine-timolol (COMBIGAN) 0.2-0.5 % ophthalmic solution Place 1 drop into both eyes every 12 (twelve) hours.   Calcium Carbonate-Vitamin D 600-400 MG-UNIT tablet Take 1 tablet by mouth daily.   Omega-3 Fatty Acids (FISH OIL) 1000 MG CAPS    VITAMIN E PO Take by mouth.   [DISCONTINUED] Carboxymethylcellulose Sod PF 1 % GEL     PHQ 2/9 Scores 05/28/2021 05/21/2021 09/24/2020 05/20/2020  PHQ - 2 Score 0 0 0 0  PHQ- 9 Score 0 0 1 -    GAD 7 : Generalized Anxiety Score 05/28/2021 09/24/2020  Nervous, Anxious, on Edge 0 0  Control/stop worrying 0 0  Worry too much - different things 0  0  Trouble relaxing 0 0  Restless 0 0  Easily annoyed or irritable 0 0  Afraid - awful might happen 0 0  Total GAD 7 Score 0 0  Anxiety Difficulty Not difficult at all -    BP Readings from Last 3 Encounters:  05/28/21 128/68  05/21/21 108/66  05/13/21 (!) 123/49    Physical Exam Vitals and nursing note reviewed.  Constitutional:      General: She is not in acute distress.    Appearance: She is well-developed.  HENT:     Head: Normocephalic and atraumatic.     Right Ear: Tympanic membrane and ear canal normal.     Left Ear: Tympanic membrane and ear canal normal.     Nose:     Right Sinus: No maxillary sinus tenderness.     Left Sinus: No maxillary sinus tenderness.  Eyes:     General: No scleral icterus.       Right  eye: No discharge.        Left eye: No discharge.     Conjunctiva/sclera: Conjunctivae normal.  Neck:     Thyroid: No thyromegaly.     Vascular: No carotid bruit.  Cardiovascular:     Rate and Rhythm: Normal rate and regular rhythm.     Pulses: Normal pulses.     Heart sounds: Normal heart sounds.  Pulmonary:     Effort: Pulmonary effort is normal. No respiratory distress.     Breath sounds: No wheezing.  Chest:  Breasts:    Right: No mass, nipple discharge, skin change or tenderness.     Left: No mass, nipple discharge, skin change or tenderness.  Abdominal:     General: Bowel sounds are normal.     Palpations: Abdomen is soft.     Tenderness: There is no abdominal tenderness.  Musculoskeletal:     Cervical back: Normal range of motion. No erythema.     Right lower leg: No edema.     Left lower leg: No edema.  Lymphadenopathy:     Cervical: No cervical adenopathy.  Skin:    General: Skin is warm and dry.     Findings: No rash.  Neurological:     Mental Status: She is alert and oriented to person, place, and time.     Cranial Nerves: No cranial nerve deficit.     Sensory: No sensory deficit.     Deep Tendon Reflexes: Reflexes are normal and  symmetric.  Psychiatric:        Attention and Perception: Attention normal.        Mood and Affect: Mood normal.    Wt Readings from Last 3 Encounters:  05/28/21 130 lb (59 kg)  05/21/21 131 lb 9.6 oz (59.7 kg)  05/13/21 131 lb (59.4 kg)    BP 128/68   Pulse (!) 59   Temp 98.1 F (36.7 C) (Oral)   Ht '5\' 1"'$  (1.549 m)   Wt 130 lb (59 kg)   SpO2 96%   BMI 24.56 kg/m   Assessment and Plan: 1. Annual physical exam Normal exam Continue healthy diet and exercise. She declines Shingrix. She has a CT scheduled to further evaluate her appendix.  2. Mixed hyperlipidemia Tolerating statin medication without side effects at this time Continue same therapy without change at this time. Consider lowering the dose if much improved. - Comprehensive metabolic panel - Lipid panel  3. Vitamin D deficiency Continue daily supplement - VITAMIN D 25 Hydroxy (Vit-D Deficiency, Fractures)  4. History of TIA (transient ischemic attack) Not recurrent Continue ASA 81 mg - CBC with Differential/Platelet  5. Nocturia Apparent UTI - will treat accordingly - POC urinalysis w microscopic (non auto) - sulfamethoxazole-trimethoprim (BACTRIM DS) 800-160 MG tablet; Take 1 tablet by mouth 2 (two) times daily for 5 days.  Dispense: 10 tablet; Refill: 0  6. Tobacco use Pt smokes 4-5 per day and is not interested in quitting at this time She does not qualify for LDCT screening   Partially dictated using Dragon software. Any errors are unintentional.  Halina Maidens, MD [facility]  05/28/2021

## 2021-05-29 LAB — CBC WITH DIFFERENTIAL/PLATELET
Basophils Absolute: 0.1 10*3/uL (ref 0.0–0.2)
Basos: 1 %
EOS (ABSOLUTE): 0.4 10*3/uL (ref 0.0–0.4)
Eos: 7 %
Hematocrit: 39.5 % (ref 34.0–46.6)
Hemoglobin: 13.3 g/dL (ref 11.1–15.9)
Immature Grans (Abs): 0 10*3/uL (ref 0.0–0.1)
Immature Granulocytes: 0 %
Lymphocytes Absolute: 1.1 10*3/uL (ref 0.7–3.1)
Lymphs: 18 %
MCH: 32.4 pg (ref 26.6–33.0)
MCHC: 33.7 g/dL (ref 31.5–35.7)
MCV: 96 fL (ref 79–97)
Monocytes Absolute: 0.5 10*3/uL (ref 0.1–0.9)
Monocytes: 9 %
Neutrophils Absolute: 3.8 10*3/uL (ref 1.4–7.0)
Neutrophils: 65 %
Platelets: 188 10*3/uL (ref 150–450)
RBC: 4.11 x10E6/uL (ref 3.77–5.28)
RDW: 11.7 % (ref 11.7–15.4)
WBC: 5.9 10*3/uL (ref 3.4–10.8)

## 2021-05-29 LAB — COMPREHENSIVE METABOLIC PANEL
ALT: 27 IU/L (ref 0–32)
AST: 25 IU/L (ref 0–40)
Albumin/Globulin Ratio: 1.4 (ref 1.2–2.2)
Albumin: 4.4 g/dL (ref 3.7–4.7)
Alkaline Phosphatase: 70 IU/L (ref 44–121)
BUN/Creatinine Ratio: 22 (ref 12–28)
BUN: 16 mg/dL (ref 8–27)
Bilirubin Total: 0.4 mg/dL (ref 0.0–1.2)
CO2: 25 mmol/L (ref 20–29)
Calcium: 10.2 mg/dL (ref 8.7–10.3)
Chloride: 102 mmol/L (ref 96–106)
Creatinine, Ser: 0.74 mg/dL (ref 0.57–1.00)
Globulin, Total: 3.1 g/dL (ref 1.5–4.5)
Glucose: 118 mg/dL — ABNORMAL HIGH (ref 65–99)
Potassium: 4.7 mmol/L (ref 3.5–5.2)
Sodium: 141 mmol/L (ref 134–144)
Total Protein: 7.5 g/dL (ref 6.0–8.5)
eGFR: 84 mL/min/{1.73_m2} (ref >59)

## 2021-05-29 LAB — LIPID PANEL
Chol/HDL Ratio: 3.5 ratio (ref 0.0–4.4)
Cholesterol, Total: 157 mg/dL (ref 100–199)
HDL: 45 mg/dL (ref >39)
LDL Chol Calc (NIH): 90 mg/dL (ref 0–99)
Triglycerides: 125 mg/dL (ref 0–149)
VLDL Cholesterol Cal: 22 mg/dL (ref 5–40)

## 2021-05-29 LAB — VITAMIN D 25 HYDROXY (VIT D DEFICIENCY, FRACTURES): Vit D, 25-Hydroxy: 32.3 ng/mL (ref 30.0–100.0)

## 2021-05-29 NOTE — Progress Notes (Signed)
A1C added [id number] R73.09.  KP

## 2021-05-31 ENCOUNTER — Encounter: Payer: Self-pay | Admitting: Internal Medicine

## 2021-05-31 LAB — SPECIMEN STATUS REPORT

## 2021-05-31 LAB — HGB A1C W/O EAG: Hgb A1c MFr Bld: 6 % — ABNORMAL HIGH (ref 4.8–5.6)

## 2021-06-02 ENCOUNTER — Encounter: Payer: Self-pay | Admitting: Internal Medicine

## 2021-06-03 DIAGNOSIS — H4043X3 Glaucoma secondary to eye inflammation, bilateral, severe stage: Secondary | ICD-10-CM | POA: Diagnosis not present

## 2021-06-03 DIAGNOSIS — H44002 Unspecified purulent endophthalmitis, left eye: Secondary | ICD-10-CM | POA: Diagnosis not present

## 2021-06-03 DIAGNOSIS — H35373 Puckering of macula, bilateral: Secondary | ICD-10-CM | POA: Diagnosis not present

## 2021-06-03 DIAGNOSIS — Z961 Presence of intraocular lens: Secondary | ICD-10-CM | POA: Diagnosis not present

## 2021-06-03 DIAGNOSIS — H43811 Vitreous degeneration, right eye: Secondary | ICD-10-CM | POA: Diagnosis not present

## 2021-06-03 DIAGNOSIS — H44113 Panuveitis, bilateral: Secondary | ICD-10-CM | POA: Diagnosis not present

## 2021-06-05 ENCOUNTER — Ambulatory Visit
Admission: RE | Admit: 2021-06-05 | Discharge: 2021-06-05 | Disposition: A | Discharge status: Home / Self Care | Payer: Medicare HMO | Source: Ambulatory Visit | Attending: Gastroenterology | Admitting: Gastroenterology

## 2021-06-05 ENCOUNTER — Other Ambulatory Visit: Payer: Self-pay

## 2021-06-05 DIAGNOSIS — K760 Fatty (change of) liver, not elsewhere classified: Secondary | ICD-10-CM | POA: Diagnosis not present

## 2021-06-05 DIAGNOSIS — K388 Other specified diseases of appendix: Secondary | ICD-10-CM | POA: Insufficient documentation

## 2021-06-05 MED ORDER — IOHEXOL 350 MG/ML SOLN
150.0000 mL | Freq: Once | INTRAVENOUS | Status: AC | PRN
  Administered 2021-06-05: 85 mL via INTRAVENOUS

## 2021-06-25 ENCOUNTER — Telehealth: Payer: Self-pay

## 2021-06-25 NOTE — Telephone Encounter (Signed)
Referral with demographics, notes, diagnostic study and insurance faxed to [facility]

## 2021-07-01 NOTE — Telephone Encounter (Signed)
I spoke to pt and advised her that I was not aware that all phone calls should go through the [place] location.... Pt said "that was interesting" and said okay, hung up after.Marland KitchenMarland KitchenMarland Kitchen

## 2021-07-07 ENCOUNTER — Other Ambulatory Visit: Payer: Self-pay

## 2021-07-07 ENCOUNTER — Ambulatory Visit: Payer: Medicare HMO | Admitting: Gastroenterology

## 2021-07-07 VITALS — BP 145/78 | HR 67 | Temp 97.7°F | Wt 132.0 lb

## 2021-07-07 DIAGNOSIS — Z86010 Personal history of colonic polyps: Secondary | ICD-10-CM

## 2021-07-07 DIAGNOSIS — K760 Fatty (change of) liver, not elsewhere classified: Secondary | ICD-10-CM

## 2021-07-07 DIAGNOSIS — R7989 Other specified abnormal findings of blood chemistry: Secondary | ICD-10-CM | POA: Diagnosis not present

## 2021-07-07 DIAGNOSIS — K388 Other specified diseases of appendix: Secondary | ICD-10-CM

## 2021-07-07 NOTE — Progress Notes (Signed)
Vonda Antigua, MD [address]  [place]  [place], [place] [address]  Main: [phone]  Fax: [phone]   Primary Care Physician: Glean Hess, MD   Chief Complaint  Patient presents with   Follow-up    Labs for fatty liver  Mucocele  HPI: Erika Collins is a 75 y.o. female who recently underwent screening colonoscopy as an outpatient and has never been seen in clinic before.  Incidentally found to have an appendiceal mucocele during the colonoscopy.  CT scan subsequently confirmed a mucocele.  Patient has been referred to [place] [facility] for surgical resection and she has tried calling them multiple times and have not heard from them about an appointment. The patient denies abdominal or flank pain, anorexia, nausea or vomiting, dysphagia, change in bowel habits or black or bloody stools or weight loss.  Her CT scan also showed fatty liver and she denies any previous history of this.  States that she tries to eat a relatively healthy diet as well  Her screening colonoscopy on 05/13/2021 showed 2 subcentimeter colon polyps that showed tubular adenoma.  Appendiceal orifice biopsies were benign  As per H&P during her average for screening colonoscopy "Pt reports 3 colonoscopies over her lifetime.  States the first 1 had 5 benign polyps.  Since then no other polyps were found on future colonoscopies.  Most recent colonoscopy record from 2012 did not show any polyps and is available in the chart.  Any other previous colonoscopies or pathology reports are not available in her chart.  States in the past she has been advised to get a colonoscopy every 10 years."  ROS: All ROS reviewed and negative except as per HPI   Past Medical History:  Diagnosis Date   Allergy    Arthritis    Glaucoma    Hx of transient ischemic attack (TIA)    over 25 yrs ago.  no deficits.   Osteoarthritis    Vertebral collapse (HCC)    C3,4,5,6,7 compressed over 50 years ago   Vertigo    over  10 yrs ago    Past Surgical History:  Procedure Laterality Date   COLONOSCOPY  02/24/2011   normal, repeat 10 years   COLONOSCOPY WITH PROPOFOL N/A 05/13/2021   Procedure: COLONOSCOPY WITH PROPOFOL;  Surgeon: Virgel Manifold, MD;  Location: [facility];  Service: Endoscopy;  Laterality: N/A;   EYE SURGERY  Medicated Implants Both Eyes   Retina Specialist   POLYPECTOMY  05/13/2021   Procedure: POLYPECTOMY;  Surgeon: Virgel Manifold, MD;  Location: [facility];  Service: Endoscopy;;   TUBAL LIGATION      Prior to Admission medications   Medication Sig Start Date End Date Taking? Authorizing Provider  Ascorbic Acid (VITAMIN C) 1000 MG tablet Take 1,000 mg by mouth daily.   Yes [provider]  aspirin 81 MG tablet Take 81 mg by mouth daily.    Yes [provider]  atorvastatin (LIPITOR) 10 MG tablet TAKE 1 TABLET BY MOUTH EVERY DAY 05/13/21  Yes Glean Hess, MD  brimonidine-timolol (COMBIGAN) 0.2-0.5 % ophthalmic solution Place 1 drop into both eyes every 12 (twelve) hours.   Yes [provider]  Calcium Carbonate-Vitamin D 600-400 MG-UNIT tablet Take 1 tablet by mouth daily.   Yes [provider]  Omega-3 Fatty Acids (FISH OIL) 1000 MG CAPS  09/22/19  Yes [provider]  VITAMIN E PO Take by mouth.   Yes [provider]  Family History  Problem Relation Age of Onset   Heart attack Father    Hyperlipidemia Sister    Breast cancer Neg Hx      Social History   Tobacco Use   Smoking status: Every Day    Packs/day: 0.25    Years: 45.00    Pack years: 11.25    Types: Cigarettes   Smokeless tobacco: Never   Tobacco comments:    3-4 cigerettes daily  Vaping Use   Vaping Use: Never used  Substance Use Topics   Alcohol use: Yes    Alcohol/week: 7.0 - 10.0 standard drinks    Types: 7 - 10 Glasses of wine per week   Drug use: Never    Allergies as of 07/07/2021   (No Known Allergies)     Physical Examination:  Constitutional: General:   Alert,  Well-developed, well-nourished, pleasant and cooperative in NAD BP (!) 145/78   Pulse 67   Temp 97.7 F (36.5 C) (Oral)   Wt 132 lb (59.9 kg)   BMI 24.94 kg/m   Respiratory: Normal respiratory effort  Gastrointestinal:  Soft, non-tender and non-distended without masses, hepatosplenomegaly or hernias noted.  No guarding or rebound tenderness.     Cardiac: No clubbing or edema.  No cyanosis. Normal posterior tibial pedal pulses noted.  Psych:  Alert and cooperative. Normal mood and affect.  Musculoskeletal:  Normal gait. Head normocephalic, atraumatic. Symmetrical without gross deformities. 5/5 Lower extremity strength bilaterally.  Skin: Warm. Intact without significant lesions or rashes. No jaundice.  Neck: Supple, trachea midline  Lymph: No cervical lymphadenopathy  Psych:  Alert and oriented x3, Alert and cooperative. Normal mood and affect.  Labs: CMP     Component Value Date/Time   NA 141 05/28/2021 0913   K 4.7 05/28/2021 0913   CL 102 05/28/2021 0913   CO2 25 05/28/2021 0913   GLUCOSE 118 (H) 05/28/2021 0913   GLUCOSE 121 (H) 05/21/2020 0917   BUN 16 05/28/2021 0913   CREATININE 0.74 05/28/2021 0913   CALCIUM 10.2 05/28/2021 0913   PROT 7.5 05/28/2021 0913   ALBUMIN 4.4 05/28/2021 0913   AST 25 05/28/2021 0913   ALT 27 05/28/2021 0913   ALKPHOS 70 05/28/2021 0913   BILITOT 0.4 05/28/2021 0913   GFRNONAA 75 09/24/2020 0931   GFRAA 87 09/24/2020 0931   Lab Results  Component Value Date   WBC 5.9 05/28/2021   HGB 13.3 05/28/2021   HCT 39.5 05/28/2021   MCV 96 05/28/2021   PLT 188 05/28/2021    Imaging Studies: CT abdomen pelvis 06/05/2021  IMPRESSION: 1. Enlarged low-density appendix compatible with the given diagnosis of mucocele. There is no evidence for rupture or peritoneal disease 2. Sigmoid diverticulosis without diverticulitis. 3. Hepatic steatosis.  Assessment and Plan:    Erika Collins is a 75 y.o. y/o female here for follow-up of mucocele of the appendix, fatty liver on CT scan  Appendiceal mucocele We will attempt to reach out to [place] [facility] in regard to her appointment as she has not heard from them in regard to her referral  Patient advised to call us in the next 2 weeks if she does not get a call from them directly to schedule her appointment  Recall clinic follow-up in 3 months to ensure surgery has been scheduled  Fatty liver Finding of fatty liver on imaging discussed with patient Diet, weight loss, and exercise encouraged along with avoiding hepatotoxic drugs including alcohol Risk of progression to cirrhosis if above measures  are not instituted were discussed as well, and patient verbalized understanding  Will obtain fatty liver work-up at this time as well  Colon polyps We extensively discussed with the patient that her most recent colonoscopy only showed 2 subcentimeter polyps that were precancerous.  Typically a 7 to 10-year follow-up was recommended but after 75 years of age surveillance colonoscopies can be done on a case-by-case basis after discussion with the patient.  Screening colonoscopies are not recommended after 75 years of age, however, since this would be a surveillance colonoscopy for the patient, patient's wishes, and health status would need to be discussed in order to determine if surveillance colonoscopy would be appropriate.  We discussed that she will be 80 to 75 years of age in 13 to 60 years and a repeat colonoscopy may not add mortality benefit to the patient.  Risks and benefits of ongoing surveillance discussed in detail, and patient states that she would like a recall placed in her chart for future colonoscopy as she potentially would want a colonoscopy at that time.  Therefore, we will place both a clinic and colonoscopy recall for her for 7 years  Dr Vonda Antigua

## 2021-07-07 NOTE — Patient Instructions (Signed)
I will reach out to [facility] to check on referral. I will contact you as soon as I have a response.

## 2021-07-07 NOTE — Telephone Encounter (Signed)
I called [facility] [place] and the person who answered the phone in appointments did state the referrals are behind, I was then transferred to referrals and Left message on voicemail requesting they call me back to discuss referral for pt and estimated time for pt to be scheduled for an appointment

## 2021-07-07 NOTE — Telephone Encounter (Signed)
Pt is aware.  

## 2021-07-08 LAB — IRON AND TIBC
Iron Saturation: 52 % (ref 15–55)
Iron: 142 ug/dL — ABNORMAL HIGH (ref 27–139)
Total Iron Binding Capacity: 271 ug/dL (ref 250–450)
UIBC: 129 ug/dL (ref 118–369)

## 2021-07-10 LAB — HEPATITIS A ANTIBODY, TOTAL: hep A Total Ab: POSITIVE — AB

## 2021-07-10 LAB — CERULOPLASMIN: Ceruloplasmin: 25.1 mg/dL (ref 19.0–39.0)

## 2021-07-10 LAB — ANA: ANA Titer 1: NEGATIVE

## 2021-07-10 LAB — MITOCHONDRIAL/SMOOTH MUSCLE AB PNL
Mitochondrial Ab: <20 Units (ref 0.0–20.0)
Smooth Muscle Ab: 13 Units (ref 0–19)

## 2021-07-10 LAB — ANTI-MICROSOMAL ANTIBODY LIVER / KIDNEY: LKM1 Ab: 3.7 Units (ref 0.0–20.0)

## 2021-07-10 LAB — IGG: IgG (Immunoglobin G), Serum: 1521 mg/dL (ref 586–1602)

## 2021-07-10 LAB — HEPATITIS B CORE ANTIBODY, TOTAL: Hep B Core Total Ab: NEGATIVE

## 2021-07-10 LAB — FERRITIN: Ferritin: 440 ng/mL — ABNORMAL HIGH (ref 15–150)

## 2021-07-10 LAB — HEPATITIS C ANTIBODY: Hep C Virus Ab: <0.1 s/co ratio (ref 0.0–0.9)

## 2021-07-10 LAB — HEPATITIS B SURFACE ANTIGEN: Hepatitis B Surface Ag: NEGATIVE

## 2021-07-10 LAB — HEPATITIS B SURFACE ANTIBODY,QUALITATIVE: Hep B Surface Ab, Qual: NONREACTIVE

## 2021-07-10 NOTE — Addendum Note (Signed)
Addended by: Vonda Antigua on: 07/10/2021 12:49 PM   Modules accepted: Orders

## 2021-07-11 DIAGNOSIS — R7989 Other specified abnormal findings of blood chemistry: Secondary | ICD-10-CM

## 2021-07-17 DIAGNOSIS — R7989 Other specified abnormal findings of blood chemistry: Secondary | ICD-10-CM | POA: Diagnosis not present

## 2021-07-19 ENCOUNTER — Encounter: Payer: Self-pay | Admitting: Oncology

## 2021-07-23 ENCOUNTER — Other Ambulatory Visit: Payer: Self-pay

## 2021-07-23 ENCOUNTER — Inpatient Hospital Stay: Payer: Medicare HMO | Attending: Oncology | Admitting: Oncology

## 2021-07-23 ENCOUNTER — Encounter: Payer: Self-pay | Admitting: Oncology

## 2021-07-23 ENCOUNTER — Inpatient Hospital Stay: Payer: Medicare HMO

## 2021-07-23 VITALS — BP 106/52 | HR 65 | Temp 97.2°F | Resp 16 | Wt 131.1 lb

## 2021-07-23 DIAGNOSIS — R69 Illness, unspecified: Secondary | ICD-10-CM | POA: Diagnosis not present

## 2021-07-23 DIAGNOSIS — F17210 Nicotine dependence, cigarettes, uncomplicated: Secondary | ICD-10-CM | POA: Insufficient documentation

## 2021-07-23 DIAGNOSIS — R7989 Other specified abnormal findings of blood chemistry: Secondary | ICD-10-CM | POA: Diagnosis not present

## 2021-07-24 LAB — HEMOCHROMATOSIS DNA-PCR(C282Y,H63D)

## 2021-07-29 DIAGNOSIS — K388 Other specified diseases of appendix: Secondary | ICD-10-CM | POA: Diagnosis not present

## 2021-07-30 ENCOUNTER — Telehealth: Payer: Self-pay

## 2021-07-30 ENCOUNTER — Encounter: Payer: Self-pay | Admitting: Internal Medicine

## 2021-07-30 NOTE — Telephone Encounter (Signed)
Received a FAX from [facility] for patient from Mammie Lorenzo, LPN- requesting a medical clearance note for patient to have a URGENT Laparoscopic Appendectomy in the next month.  Called and left a VM with the front desk receptionist leaving a message for Erika Collins letting her know Dr. Army Melia does not do notes, but if she sends over a medical clearance form - Dr. Army Melia can fill this out and fax back.

## 2021-07-30 NOTE — Telephone Encounter (Signed)
[facility] called back in stating they do not have forms and that PCP can just send over a letter stating if the pt is clear or not, please advise.

## 2021-07-31 NOTE — Progress Notes (Signed)
Hematology/Oncology Consult note [facility] Telephone:([phone] Fax:([phone]) [phone]  Patient Care Team: Glean Hess, MD as PCP - General (Internal Medicine) Karren Burly Deirdre Peer, MD as Consulting Physician (Ophthalmology) Dr. Kallie Locks as Consulting Physician ([facility]) Virgel Manifold, MD as Consulting Physician (Gastroenterology)   Name of the patient: Erika Collins  [id number]  [birth year]    Reason for referral-elevated ferritin   Referring physician-Dr. Bonna Gains  Date of visit: 07/31/21   History of presenting illness- Patient is a 75 year old female who was seen by Dr. Bonna Gains after CT scan finding showed fatty liver located as a part of the fatty liver work-up she had a ferritin levels checked which were elevated at 440.  No prior values available for comparison.  LFTs are normal.  HFE gene assay was done a week ago and was pending at the time of my visit.  Iron saturation and iron studies was 52%.  Patient otherwise feels well and denies any symptoms of nausea vomiting diarrhea or changes in her bowel habits.  ECOG PS- 1  Pain scale- 0   Review of systems- Review of Systems  Constitutional:  Negative for chills, fever, malaise/fatigue and weight loss.  HENT:  Negative for congestion, ear discharge and nosebleeds.   Eyes:  Negative for blurred vision.  Respiratory:  Negative for cough, hemoptysis, sputum production, shortness of breath and wheezing.   Cardiovascular:  Negative for chest pain, palpitations, orthopnea and claudication.  Gastrointestinal:  Negative for abdominal pain, blood in stool, constipation, diarrhea, heartburn, melena, nausea and vomiting.  Genitourinary:  Negative for dysuria, flank pain, frequency, hematuria and urgency.  Musculoskeletal:  Negative for back pain, joint pain and myalgias.  Skin:  Negative for rash.  Neurological:  Negative for dizziness, tingling, focal weakness, seizures,  weakness and headaches.  Endo/Heme/Allergies:  Does not bruise/bleed easily.  Psychiatric/Behavioral:  Negative for depression and suicidal ideas. The patient does not have insomnia.    No Known Allergies  Patient Active Problem List   Diagnosis Date Noted   Polyp of transverse colon    Chronic bilateral low back pain with left-sided sciatica 10/11/2019   OA (osteoarthritis) of finger, unspecified laterality 05/19/2019   Numbness and tingling of foot 05/19/2019   Cervical disc disorder with radiculopathy 05/17/2018   History of TIA (transient ischemic attack) 05/17/2018   Vitamin D deficiency 05/17/2018   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   Mixed hyperlipidemia 12/13/2017   Tobacco use 12/13/2017   Primary open angle glaucoma 01/13/2013     Past Medical History:  Diagnosis Date   Allergy    Arthritis    Glaucoma    Hx of transient ischemic attack (TIA)    over 25 yrs ago.  no deficits.   Osteoarthritis    Vertebral collapse (HCC)    C3,4,5,6,7 compressed over 50 years ago   Vertigo    over 10 yrs ago     Past Surgical History:  Procedure Laterality Date   COLONOSCOPY  02/24/2011   normal, repeat 10 years   COLONOSCOPY WITH PROPOFOL N/A 05/13/2021   Procedure: COLONOSCOPY WITH PROPOFOL;  Surgeon: Virgel Manifold, MD;  Location: [facility];  Service: Endoscopy;  Laterality: N/A;   EYE SURGERY  Medicated Implants Both Eyes   Retina Specialist   POLYPECTOMY  05/13/2021   Procedure: POLYPECTOMY;  Surgeon: Virgel Manifold, MD;  Location: [facility];  Service: Endoscopy;;   TUBAL LIGATION      Social History   Socioeconomic  History   Marital status: Widowed    Spouse name: Not on file   Number of children: 1   Years of education: some college   Highest education level: 12th grade  Occupational History   Occupation: Retired  Tobacco Use   Smoking status: Every Day    Packs/day: 0.25    Years: 45.00    Pack years: 11.25    Types:  Cigarettes   Smokeless tobacco: Never   Tobacco comments:    3-4 cigerettes daily  Vaping Use   Vaping Use: Never used  Substance and Sexual Activity   Alcohol use: Yes    Alcohol/week: 7.0 - 10.0 standard drinks    Types: 7 - 10 Glasses of wine per week   Drug use: Never   Sexual activity: Not Currently  Other Topics Concern   Not on file  Social History Narrative   Pt lives alone.    Social Determinants of Health   Financial Resource Strain: Low Risk    Difficulty of Paying Living Expenses: Not hard at all  Food Insecurity: No Food Insecurity   Worried About Charity fundraiser in the Last Year: Never true   [place] in the Last Year: Never true  Transportation Needs: No Transportation Needs   Lack of Transportation (Medical): No   Lack of Transportation (Non-Medical): No  Physical Activity: Sufficiently Active   Days of Exercise per Week: 7 days   Minutes of Exercise per Session: 30 min  Stress: No Stress Concern Present   Feeling of Stress : Not at all  Social Connections: Socially Isolated   Frequency of Communication with Friends and Family: More than three times a week   Frequency of Social Gatherings with Friends and Family: Twice a week   Attends Religious Services: Never   Marine scientist or Organizations: No   Attends Archivist Meetings: Never   Marital Status: Widowed  Human resources officer Violence: Not At Risk   Fear of Current or Ex-Partner: No   Emotionally Abused: No   Physically Abused: No   Sexually Abused: No     Family History  Problem Relation Age of Onset   Heart attack Father    Hyperlipidemia Sister    Breast cancer Neg Hx      Current Outpatient Medications:    Ascorbic Acid (VITAMIN C) 1000 MG tablet, Take 1,000 mg by mouth daily., Disp: , Rfl:    aspirin 81 MG tablet, Take 81 mg by mouth daily. , Disp: , Rfl:    atorvastatin (LIPITOR) 10 MG tablet, TAKE 1 TABLET BY MOUTH EVERY DAY, Disp: 90 tablet, Rfl: 0    brimonidine-timolol (COMBIGAN) 0.2-0.5 % ophthalmic solution, Place 1 drop into both eyes every 12 (twelve) hours., Disp: , Rfl:    Calcium Carbonate-Vitamin D 600-400 MG-UNIT tablet, Take 1 tablet by mouth daily., Disp: , Rfl:    Omega-3 Fatty Acids (FISH OIL) 1000 MG CAPS, , Disp: , Rfl:    sulfamethoxazole-trimethoprim (BACTRIM DS) 800-160 MG tablet, , Disp: , Rfl:    VITAMIN E PO, Take by mouth., Disp: , Rfl:    Physical exam:  Vitals:   07/23/21 1402  BP: (!) 106/52  Pulse: 65  Resp: 16  Temp: (!) 97.2 F (36.2 C)  SpO2: 98%  Weight: 131 lb 1.6 oz (59.5 kg)   Physical Exam Constitutional:      General: She is not in acute distress. Cardiovascular:     Rate and Rhythm:  Normal rate and regular rhythm.     Heart sounds: Normal heart sounds.  Pulmonary:     Effort: Pulmonary effort is normal.     Breath sounds: Normal breath sounds.  Abdominal:     General: Bowel sounds are normal.     Palpations: Abdomen is soft.     Comments: No palpable hepatosplenomegaly  Skin:    General: Skin is warm and dry.  Neurological:     Mental Status: She is alert and oriented to person, place, and time.       CMP Latest Ref Rng & Units 05/28/2021  Glucose 65 - 99 mg/dL 118(H)  BUN 8 - 27 mg/dL 16  Creatinine 0.57 - 1.00 mg/dL 0.74  Sodium 134 - 144 mmol/L 141  Potassium 3.5 - 5.2 mmol/L 4.7  Chloride 96 - 106 mmol/L 102  CO2 20 - 29 mmol/L 25  Calcium 8.7 - 10.3 mg/dL 10.2  Total Protein 6.0 - 8.5 g/dL 7.5  Total Bilirubin 0.0 - 1.2 mg/dL 0.4  Alkaline Phos 44 - 121 IU/L 70  AST 0 - 40 IU/L 25  ALT 0 - 32 IU/L 27   CBC Latest Ref Rng & Units 05/28/2021  WBC 3.4 - 10.8 x10E3/uL 5.9  Hemoglobin 11.1 - 15.9 g/dL 13.3  Hematocrit 34.0 - 46.6 % 39.5  Platelets 150 - 450 x10E3/uL 188    No images are attached to the encounter.  No results found.  Assessment and plan- Patient is a 75 y.o. female referred for elevated ferritin  Mildly elevated ferritin in the absence of abnormal  LFTs or elevated iron saturation.  HFE gene testing was pending at the time of my visit.  Overall patient is asymptomatic and ferritin levels could be elevated as a part of her reactive process as well.  I am inclined to monitor this conservatively without the need for further testing.  I will see her back in 3 months with CBC CMP ferritin iron studies.  Likelihood that patient has hereditary hemochromatosis in the absence of elevated iron saturation is low   Thank you for this kind referral and the opportunity to participate in the care of this patient   Visit Diagnosis 1. Elevated ferritin     Dr. Randa Evens, MD, MPH [facility] at [facility] [phone] 07/31/2021

## 2021-08-10 ENCOUNTER — Other Ambulatory Visit: Payer: Self-pay | Admitting: Internal Medicine

## 2021-08-10 DIAGNOSIS — E785 Hyperlipidemia, unspecified: Secondary | ICD-10-CM

## 2021-08-10 NOTE — Telephone Encounter (Signed)
Requested Prescriptions  Pending Prescriptions Disp Refills  . atorvastatin (LIPITOR) 10 MG tablet [Pharmacy Med Name: ATORVASTATIN 10 MG TABLET] 90 tablet 2    Sig: TAKE 1 TABLET BY MOUTH EVERY DAY     Cardiovascular:  Antilipid - Statins Passed - 08/10/2021 10:58 AM      Passed - Total Cholesterol in normal range and within 360 days    Cholesterol, Total  Date Value Ref Range Status  05/28/2021 157 100 - 199 mg/dL Final         Passed - LDL in normal range and within 360 days    LDL Chol Calc (NIH)  Date Value Ref Range Status  05/28/2021 90 0 - 99 mg/dL Final         Passed - HDL in normal range and within 360 days    HDL  Date Value Ref Range Status  05/28/2021 45 >39 mg/dL Final         Passed - Triglycerides in normal range and within 360 days    Triglycerides  Date Value Ref Range Status  05/28/2021 125 0 - 149 mg/dL Final         Passed - Patient is not pregnant      Passed - Valid encounter within last 12 months    Recent Outpatient Visits          2 months ago Annual physical exam   [facility] Glean Hess, MD   10 months ago Mixed hyperlipidemia   [facility] Glean Hess, MD   1 year ago Annual physical exam   [facility] Glean Hess, MD   1 year ago Cervical disc disorder with radiculopathy   [facility] Glean Hess, MD   2 years ago Annual physical exam   [facility] Glean Hess, MD      Future Appointments            In 2 months Sindy Guadeloupe, [facility]   In 9 months Army Melia, Jesse Sans, MD [facility], [facility]

## 2021-08-20 DIAGNOSIS — L570 Actinic keratosis: Secondary | ICD-10-CM | POA: Diagnosis not present

## 2021-08-21 NOTE — Progress Notes (Signed)
Requested surgery orders with Erika Collins at Dr. Orest Dikes office.

## 2021-08-22 NOTE — Patient Instructions (Addendum)
DUE TO COVID-19 ONLY ONE VISITOR IS ALLOWED TO COME WITH YOU AND STAY IN THE WAITING ROOM ONLY DURING PRE OP AND PROCEDURE.   **NO VISITORS ARE ALLOWED IN THE SHORT STAY AREA OR RECOVERY ROOM!!**   You are not required to quarantine, however you are required to wear a well-fitted mask when you are out and around people not in your household.  Hand Hygiene often Do NOT share personal items Notify your provider if you are in close contact with someone who has COVID or you develop fever 100.4 or greater, new onset of sneezing, cough, sore throat, shortness of breath or body aches.    Your procedure is scheduled on:   Thursday, 09-11-21   Report to [facility] Main  Entrance     Report to admitting at 9:15 AM   Call this number if you have problems the morning of surgery [phone]   Follow a clear liquid diet day of prep to avoid dehydration.    Follow prep instructions from surgeon's office   May have clear liquids until 8:30 AM day of surgery  CLEAR LIQUID DIET  Foods Allowed                                                                     Foods Excluded  Water, Black Coffee (no milk/no creamer) and tea, regular and decaf                              liquids that you cannot  Plain Jell-O in any flavor  (No red)                         see through such as: Fruit ices (not with fruit pulp)                                 milk, soups, orange juice  Iced Popsicles (No red)                                    All solid food                             Apple juices Sports drinks like Gatorade (No red) Lightly seasoned clear broth or consume(fat free) Sugar    Complete two  Ensure drinks  the night before surgery by 10 PM.  Drink one Ensure presurgical drink the morning of surgery at 8:30 AM.      The day of surgery:  Drink ONE (1) Pre-Surgery Clear Ensure the morning of surgery. Drink in one sitting. Do not sip.  This drink was given to you during your hospital   pre-op appointment visit. Nothing else to drink after completing the Pre-Surgery Clear Ensure          If you have questions, please contact your surgeon's office.     Oral Hygiene is also important to reduce your risk of infection.  Remember - BRUSH YOUR TEETH THE MORNING OF SURGERY WITH YOUR REGULAR TOOTHPASTE   Do NOT smoke after Midnight   Take these medicines the morning of surgery with A SIP OF WATER:  Atorvastatin                    Aspirin - hold x5 days prior to surgery   Stop all vitamins and herbal supplements a week before surgery             You may not have any metal on your body including hair pins, jewelry, and body piercing             Do not wear make-up, lotions, powders, perfumes or deodorant  Do not wear nail polish including gel and S&S, artificial/acrylic nails, or any other type of covering on natural nails including finger and toenails. If you have artificial nails, gel coating, etc. that needs to be removed by a nail salon please have this removed prior to surgery or surgery may need to be canceled/ delayed if the surgeon/ anesthesia feels like they are unable to be safely monitored.   Do not shave  48 hours prior to surgery.   Do not bring valuables to the hospital. [place].   Contacts, dentures or bridgework may not be worn into surgery.    Patients discharged the day of surgery will not be allowed to drive home.  Please read over the following fact sheets you were given: IF YOU HAVE QUESTIONS ABOUT YOUR PRE OP INSTRUCTIONS PLEASE CALL [facility] - Preparing for Surgery Before surgery, you can play an important role.  Because skin is not sterile, your skin needs to be as free of germs as possible.  You can reduce the number of germs on your skin by washing with CHG (chlorahexidine gluconate) soap before surgery.  CHG is an antiseptic cleaner which kills germs and  bonds with the skin to continue killing germs even after washing. Please DO NOT use if you have an allergy to CHG or antibacterial soaps.  If your skin becomes reddened/irritated stop using the CHG and inform your nurse when you arrive at Short Stay. Do not shave (including legs and underarms) for at least 48 hours prior to the first CHG shower.  You may shave your face/neck.  Please follow these instructions carefully:  1.  Shower with CHG Soap the night before surgery and the  morning of surgery.  2.  If you choose to wash your hair, wash your hair first as usual with your normal  shampoo.  3.  After you shampoo, rinse your hair and body thoroughly to remove the shampoo.                             4.  Use CHG as you would any other liquid soap.  You can apply chg directly to the skin and wash.  Gently with a scrungie or clean washcloth.  5.  Apply the CHG Soap to your body ONLY FROM THE NECK DOWN.   Do   not use on face/ open                           Wound or open sores. Avoid contact with eyes, ears mouth and   genitals (private parts).  Wash face,  Genitals (private parts) with your normal soap.             6.  Wash thoroughly, paying special attention to the area where your    surgery  will be performed.  7.  Thoroughly rinse your body with warm water from the neck down.  8.  DO NOT shower/wash with your normal soap after using and rinsing off the CHG Soap.                9.  Pat yourself dry with a clean towel.            10.  Wear clean pajamas.            11.  Place clean sheets on your bed the night of your first shower and do not  sleep with pets. Day of Surgery : Do not apply any lotions/deodorants the morning of surgery.  Please wear clean clothes to the hospital/surgery center.  FAILURE TO FOLLOW THESE INSTRUCTIONS MAY RESULT IN THE CANCELLATION OF YOUR SURGERY  PATIENT SIGNATURE_________________________________  NURSE  SIGNATURE__________________________________  ________________________________________________________________________    Erika Collins  An incentive spirometer is a tool that can help keep your lungs clear and active. This tool measures how well you are filling your lungs with each breath. Taking long deep breaths may help reverse or decrease the chance of developing breathing (pulmonary) problems (especially infection) following: A long period of time when you are unable to move or be active. BEFORE THE PROCEDURE  If the spirometer includes an indicator to show your best effort, your nurse or respiratory therapist will set it to a desired goal. If possible, sit up straight or lean slightly forward. Try not to slouch. Hold the incentive spirometer in an upright position. INSTRUCTIONS FOR USE  Sit on the edge of your bed if possible, or sit up as far as you can in bed or on a chair. Hold the incentive spirometer in an upright position. Breathe out normally. Place the mouthpiece in your mouth and seal your lips tightly around it. Breathe in slowly and as deeply as possible, raising the piston or the ball toward the top of the column. Hold your breath for 3-5 seconds or for as long as possible. Allow the piston or ball to fall to the bottom of the column. Remove the mouthpiece from your mouth and breathe out normally. Rest for a few seconds and repeat Steps 1 through 7 at least 10 times every 1-2 hours when you are awake. Take your time and take a few normal breaths between deep breaths. The spirometer may include an indicator to show your best effort. Use the indicator as a goal to work toward during each repetition. After each set of 10 deep breaths, practice coughing to be sure your lungs are clear. If you have an incision (the cut made at the time of surgery), support your incision when coughing by placing a pillow or rolled up towels firmly against it. Once you are able to get out of  bed, walk around indoors and cough well. You may stop using the incentive spirometer when instructed by your caregiver.  RISKS AND COMPLICATIONS Take your time so you do not get dizzy or light-headed. If you are in pain, you may need to take or ask for pain medication before doing incentive spirometry. It is harder to take a deep breath if you are having pain. AFTER USE Rest and breathe slowly and easily. It can be  helpful to keep track of a log of your progress. Your caregiver can provide you with a simple table to help with this. If you are using the spirometer at home, follow these instructions: [place] IF:  You are having difficultly using the spirometer. You have trouble using the spirometer as often as instructed. Your pain medication is not giving enough relief while using the spirometer. You develop fever of 100.5 F (38.1 C) or higher. SEEK IMMEDIATE MEDICAL CARE IF:  You cough up bloody sputum that had not been present before. You develop fever of 102 F (38.9 C) or greater. You develop worsening pain at or near the incision site. MAKE SURE YOU:  Understand these instructions. Will watch your condition. Will get help right away if you are not doing well or get worse. Document Released: 01/18/2007 Document Revised: 11/30/2011 Document Reviewed: 03/21/2007 ExitCare Patient Information 2014 ExitCare, [place].   ________________________________________________________________________  WHAT IS A BLOOD TRANSFUSION? Blood Transfusion Information  A transfusion is the replacement of blood or some of its parts. Blood is made up of multiple cells which provide different functions. Red blood cells carry oxygen and are used for blood loss replacement. White blood cells fight against infection. Platelets control bleeding. Plasma helps clot blood. Other blood products are available for specialized needs, such as hemophilia or other clotting disorders. BEFORE THE TRANSFUSION   Who gives blood for transfusions?  Healthy volunteers who are fully evaluated to make sure their blood is safe. This is blood bank blood. Transfusion therapy is the safest it has ever been in the practice of medicine. Before blood is taken from a donor, a complete history is taken to make sure that person has no history of diseases nor engages in risky social behavior (examples are intravenous drug use or sexual activity with multiple partners). The donor's travel history is screened to minimize risk of transmitting infections, such as malaria. The donated blood is tested for signs of infectious diseases, such as HIV and hepatitis. The blood is then tested to be sure it is compatible with you in order to minimize the chance of a transfusion reaction. If you or a relative donates blood, this is often done in anticipation of surgery and is not appropriate for emergency situations. It takes many days to process the donated blood. RISKS AND COMPLICATIONS Although transfusion therapy is very safe and saves many lives, the main dangers of transfusion include:  Getting an infectious disease. Developing a transfusion reaction. This is an allergic reaction to something in the blood you were given. Every precaution is taken to prevent this. The decision to have a blood transfusion has been considered carefully by your caregiver before blood is given. Blood is not given unless the benefits outweigh the risks. AFTER THE TRANSFUSION Right after receiving a blood transfusion, you will usually feel much better and more energetic. This is especially true if your red blood cells have gotten low (anemic). The transfusion raises the level of the red blood cells which carry oxygen, and this usually causes an energy increase. The nurse administering the transfusion will monitor you carefully for complications. HOME CARE INSTRUCTIONS  No special instructions are needed after a transfusion. You may find your energy is  better. Speak with your caregiver about any limitations on activity for underlying diseases you may have. SEEK MEDICAL CARE IF:  Your condition is not improving after your transfusion. You develop redness or irritation at the intravenous (IV) site. SEEK IMMEDIATE MEDICAL CARE IF:  Any  of the following symptoms occur over the next 12 hours: Shaking chills. You have a temperature by mouth above 102 F (38.9 C), not controlled by medicine. Chest, back, or muscle pain. People around you feel you are not acting correctly or are confused. Shortness of breath or difficulty breathing. Dizziness and fainting. You get a rash or develop hives. You have a decrease in urine output. Your urine turns a dark color or changes to pink, red, or brown. Any of the following symptoms occur over the next 10 days: You have a temperature by mouth above 102 F (38.9 C), not controlled by medicine. Shortness of breath. Weakness after normal activity. The white part of the eye turns yellow (jaundice). You have a decrease in the amount of urine or are urinating less often. Your urine turns a dark color or changes to pink, red, or brown. Document Released: 09/04/2000 Document Revised: 11/30/2011 Document Reviewed: 04/23/2008 [facility] Patient Information 2014 [place], [place].  _______________________________________________________________________

## 2021-08-22 NOTE — Progress Notes (Addendum)
COVID swab appointment: N/A  COVID Vaccine Completed: Yes x2 Date COVID Vaccine completed: 11-10-19 12-01-19 Has received booster: Yes x2 08-12-20 08-21-21 COVID vaccine manufacturer: [employer]  Date of COVID positive in last 90 days:  No  PCP - Halina Maidens, MD Cardiologist - N/A  Chest x-ray -  N/A EKG -  N/A Stress Test -  N/A ECHO -  N/A Cardiac Cath -  N/A Pacemaker/ICD device last checked: Spinal Cord Stimulator:  Sleep Study -  N/A CPAP -   Fasting Blood Sugar -  N/A Checks Blood Sugar _____ times a day  Blood Thinner Instructions: Aspirin Instructions:  ASA 81 mg. Hold x5 days.  Patient aware Last Dose:  Activity level:  Can go up a flight of stairs and perform activities of daily living without stopping and without symptoms of chest pain or shortness of breath.   Anesthesia review:  N/A  Patient denies shortness of breath, fever, cough and chest pain at PAT appointment  Patient verbalized understanding of instructions that were given to them at the PAT appointment. Patient was also instructed that they will need to review over the PAT instructions again at home before surgery.

## 2021-08-26 ENCOUNTER — Other Ambulatory Visit: Payer: Self-pay

## 2021-08-26 ENCOUNTER — Encounter (HOSPITAL_COMMUNITY): Payer: Self-pay

## 2021-08-26 ENCOUNTER — Ambulatory Visit: Payer: Self-pay | Admitting: Surgery

## 2021-08-26 ENCOUNTER — Encounter (HOSPITAL_COMMUNITY)
Admission: RE | Admit: 2021-08-26 | Discharge: 2021-08-26 | Disposition: A | Discharge status: Home / Self Care | Payer: Medicare HMO | Source: Ambulatory Visit | Attending: Surgery | Admitting: Surgery

## 2021-08-26 VITALS — BP 153/70 | HR 66 | Temp 97.7°F | Resp 18 | Ht 61.0 in | Wt 130.2 lb

## 2021-08-26 DIAGNOSIS — Z01818 Encounter for other preprocedural examination: Secondary | ICD-10-CM

## 2021-08-26 DIAGNOSIS — Z01812 Encounter for preprocedural laboratory examination: Secondary | ICD-10-CM | POA: Insufficient documentation

## 2021-08-26 HISTORY — DX: Other specified diseases of appendix: K38.8

## 2021-08-26 LAB — CBC
HCT: 39.4 % (ref 36.0–46.0)
Hemoglobin: 12.9 g/dL (ref 12.0–15.0)
MCH: 32.8 pg (ref 26.0–34.0)
MCHC: 32.7 g/dL (ref 30.0–36.0)
MCV: 100.3 fL — ABNORMAL HIGH (ref 80.0–100.0)
Platelets: 153 10*3/uL (ref 150–400)
RBC: 3.93 MIL/uL (ref 3.87–5.11)
RDW: 12.9 % (ref 11.5–15.5)
WBC: 6.4 10*3/uL (ref 4.0–10.5)
nRBC: 0 % (ref 0.0–0.2)

## 2021-08-26 NOTE — Progress Notes (Signed)
At PAT appointment patient stated that she was not aware of a bowel prep and had not received instructions or prescriptions.  Called Dr. Orest Dikes office and spoke to [place] who stated that she would discuss with Dr. Dema Severin and call patient back.  Patient also given the phone number to Dr. Orest Dikes office to follow up.

## 2021-09-11 ENCOUNTER — Encounter (HOSPITAL_COMMUNITY)
Admission: RE | Disposition: A | Discharge status: Home / Self Care | Payer: Self-pay | Source: Ambulatory Visit | Attending: Surgery

## 2021-09-11 ENCOUNTER — Ambulatory Visit (HOSPITAL_COMMUNITY): Payer: Medicare HMO | Admitting: Certified Registered Nurse Anesthetist

## 2021-09-11 ENCOUNTER — Ambulatory Visit (HOSPITAL_COMMUNITY)
Admission: RE | Admit: 2021-09-11 | Discharge: 2021-09-11 | Disposition: A | Discharge status: Home / Self Care | Payer: Medicare HMO | Source: Ambulatory Visit | Attending: Surgery | Admitting: Surgery

## 2021-09-11 ENCOUNTER — Encounter (HOSPITAL_COMMUNITY): Payer: Self-pay | Admitting: Surgery

## 2021-09-11 DIAGNOSIS — R1909 Other intra-abdominal and pelvic swelling, mass and lump: Secondary | ICD-10-CM | POA: Diagnosis present

## 2021-09-11 DIAGNOSIS — K388 Other specified diseases of appendix: Secondary | ICD-10-CM | POA: Diagnosis not present

## 2021-09-11 DIAGNOSIS — H409 Unspecified glaucoma: Secondary | ICD-10-CM | POA: Diagnosis not present

## 2021-09-11 DIAGNOSIS — Z01818 Encounter for other preprocedural examination: Secondary | ICD-10-CM

## 2021-09-11 DIAGNOSIS — M1990 Unspecified osteoarthritis, unspecified site: Secondary | ICD-10-CM | POA: Diagnosis not present

## 2021-09-11 DIAGNOSIS — C181 Malignant neoplasm of appendix: Secondary | ICD-10-CM | POA: Diagnosis not present

## 2021-09-11 DIAGNOSIS — Z8673 Personal history of transient ischemic attack (TIA), and cerebral infarction without residual deficits: Secondary | ICD-10-CM | POA: Diagnosis not present

## 2021-09-11 HISTORY — PX: LAPAROSCOPIC APPENDECTOMY: SHX408

## 2021-09-11 SURGERY — APPENDECTOMY, LAPAROSCOPIC
Anesthesia: General | Site: Abdomen

## 2021-09-11 MED ORDER — BUPIVACAINE-EPINEPHRINE (PF) 0.25% -1:200000 IJ SOLN
INTRAMUSCULAR | Status: DC | PRN
  Administered 2021-09-11: 17 mL

## 2021-09-11 MED ORDER — ACETAMINOPHEN 500 MG PO TABS
1000.0000 mg | ORAL_TABLET | Freq: Once | ORAL | Status: AC
  Administered 2021-09-11: 12:00:00 1000 mg via ORAL
  Filled 2021-09-11: qty 2

## 2021-09-11 MED ORDER — OXYCODONE HCL 5 MG PO TABS
ORAL_TABLET | ORAL | Status: AC
  Filled 2021-09-11: qty 1

## 2021-09-11 MED ORDER — LIDOCAINE 2% (20 MG/ML) 5 ML SYRINGE
INTRAMUSCULAR | Status: DC | PRN
  Administered 2021-09-11: 80 mg via INTRAVENOUS

## 2021-09-11 MED ORDER — DEXAMETHASONE SODIUM PHOSPHATE 10 MG/ML IJ SOLN
INTRAMUSCULAR | Status: AC
  Filled 2021-09-11: qty 1

## 2021-09-11 MED ORDER — ALVIMOPAN 12 MG PO CAPS
12.0000 mg | ORAL_CAPSULE | ORAL | Status: AC
  Administered 2021-09-11: 12:00:00 12 mg via ORAL
  Filled 2021-09-11: qty 1

## 2021-09-11 MED ORDER — CHLORHEXIDINE GLUCONATE 0.12 % MT SOLN
15.0000 mL | Freq: Once | OROMUCOSAL | Status: AC
  Administered 2021-09-11: 12:00:00 15 mL via OROMUCOSAL

## 2021-09-11 MED ORDER — ONDANSETRON HCL 4 MG/2ML IJ SOLN
INTRAMUSCULAR | Status: DC | PRN
  Administered 2021-09-11: 4 mg via INTRAVENOUS

## 2021-09-11 MED ORDER — ACETAMINOPHEN 500 MG PO TABS: 1000.0000 mg | ORAL_TABLET | ORAL | Status: DC

## 2021-09-11 MED ORDER — OXYCODONE HCL 5 MG/5ML PO SOLN: 5.0000 mg | Freq: Once | ORAL | Status: DC | PRN

## 2021-09-11 MED ORDER — RINGERS IRRIGATION IR SOLN
Status: DC | PRN
  Administered 2021-09-11: 1

## 2021-09-11 MED ORDER — ENSURE PRE-SURGERY PO LIQD
296.0000 mL | Freq: Once | ORAL | Status: DC
  Filled 2021-09-11: qty 296

## 2021-09-11 MED ORDER — ORAL CARE MOUTH RINSE: 15.0000 mL | Freq: Once | OROMUCOSAL | Status: AC

## 2021-09-11 MED ORDER — DEXAMETHASONE SODIUM PHOSPHATE 4 MG/ML IJ SOLN
INTRAMUSCULAR | Status: DC | PRN
  Administered 2021-09-11: 5 mg via INTRAVENOUS

## 2021-09-11 MED ORDER — FENTANYL CITRATE (PF) 100 MCG/2ML IJ SOLN
INTRAMUSCULAR | Status: AC
  Filled 2021-09-11: qty 2

## 2021-09-11 MED ORDER — EPHEDRINE SULFATE-NACL 50-0.9 MG/10ML-% IV SOSY
PREFILLED_SYRINGE | INTRAVENOUS | Status: DC | PRN
  Administered 2021-09-11: 5 mg via INTRAVENOUS

## 2021-09-11 MED ORDER — TRAMADOL HCL 50 MG PO TABS: 50.0000 mg | ORAL_TABLET | Freq: Four times a day (QID) | ORAL | 0 refills | Status: AC | PRN

## 2021-09-11 MED ORDER — SODIUM CHLORIDE 0.9 % IV SOLN
2.0000 g | INTRAVENOUS | Status: AC
  Administered 2021-09-11: 12:00:00 2 g via INTRAVENOUS
  Filled 2021-09-11: qty 2

## 2021-09-11 MED ORDER — HEPARIN SODIUM (PORCINE) 5000 UNIT/ML IJ SOLN: 5000.0000 [IU] | Freq: Once | INTRAMUSCULAR | Status: DC

## 2021-09-11 MED ORDER — CHLORHEXIDINE GLUCONATE CLOTH 2 % EX PADS: 6.0000 | MEDICATED_PAD | Freq: Once | CUTANEOUS | Status: DC

## 2021-09-11 MED ORDER — BUPIVACAINE LIPOSOME 1.3 % IJ SUSP: 20.0000 mL | Freq: Once | INTRAMUSCULAR | Status: DC

## 2021-09-11 MED ORDER — TRAMADOL HCL 50 MG PO TABS
50.0000 mg | ORAL_TABLET | Freq: Once | ORAL | Status: AC | PRN
  Administered 2021-09-11: 14:00:00 50 mg via ORAL

## 2021-09-11 MED ORDER — FENTANYL CITRATE (PF) 100 MCG/2ML IJ SOLN
INTRAMUSCULAR | Status: DC | PRN
  Administered 2021-09-11 (×4): 50 ug via INTRAVENOUS

## 2021-09-11 MED ORDER — POLYETHYLENE GLYCOL 3350 17 GM/SCOOP PO POWD: 1.0000 | Freq: Once | ORAL | Status: DC

## 2021-09-11 MED ORDER — FENTANYL CITRATE PF 50 MCG/ML IJ SOSY: 25.0000 ug | PREFILLED_SYRINGE | INTRAMUSCULAR | Status: DC | PRN

## 2021-09-11 MED ORDER — SUGAMMADEX SODIUM 200 MG/2ML IV SOLN
INTRAVENOUS | Status: DC | PRN
  Administered 2021-09-11: 200 mg via INTRAVENOUS

## 2021-09-11 MED ORDER — PROPOFOL 10 MG/ML IV BOLUS
INTRAVENOUS | Status: AC
  Filled 2021-09-11: qty 20

## 2021-09-11 MED ORDER — OXYCODONE HCL 5 MG PO TABS: 5.0000 mg | ORAL_TABLET | Freq: Once | ORAL | Status: DC | PRN

## 2021-09-11 MED ORDER — LACTATED RINGERS IV SOLN: INTRAVENOUS | Status: DC

## 2021-09-11 MED ORDER — EPHEDRINE 5 MG/ML INJ
INTRAVENOUS | Status: AC
  Filled 2021-09-11: qty 5

## 2021-09-11 MED ORDER — ENSURE PRE-SURGERY PO LIQD
592.0000 mL | Freq: Once | ORAL | Status: DC
  Filled 2021-09-11: qty 592

## 2021-09-11 MED ORDER — SODIUM CHLORIDE 0.9 % IR SOLN
Status: DC | PRN
  Administered 2021-09-11: 1000 mL

## 2021-09-11 MED ORDER — NEOMYCIN SULFATE 500 MG PO TABS: 1000.0000 mg | ORAL_TABLET | ORAL | Status: DC

## 2021-09-11 MED ORDER — METRONIDAZOLE 500 MG PO TABS: 1000.0000 mg | ORAL_TABLET | ORAL | Status: DC

## 2021-09-11 MED ORDER — TRAMADOL HCL 50 MG PO TABS
ORAL_TABLET | ORAL | Status: AC
  Filled 2021-09-11: qty 1

## 2021-09-11 MED ORDER — ROCURONIUM BROMIDE 10 MG/ML (PF) SYRINGE
PREFILLED_SYRINGE | INTRAVENOUS | Status: DC | PRN
  Administered 2021-09-11: 50 mg via INTRAVENOUS

## 2021-09-11 MED ORDER — ONDANSETRON HCL 4 MG/2ML IJ SOLN
INTRAMUSCULAR | Status: AC
  Filled 2021-09-11: qty 2

## 2021-09-11 MED ORDER — BUPIVACAINE-EPINEPHRINE (PF) 0.25% -1:200000 IJ SOLN
INTRAMUSCULAR | Status: AC
  Filled 2021-09-11: qty 30

## 2021-09-11 MED ORDER — BISACODYL 5 MG PO TBEC: 20.0000 mg | DELAYED_RELEASE_TABLET | Freq: Once | ORAL | Status: DC

## 2021-09-11 MED ORDER — PROPOFOL 10 MG/ML IV BOLUS
INTRAVENOUS | Status: DC | PRN
  Administered 2021-09-11: 60 mg via INTRAVENOUS
  Administered 2021-09-11: 100 mg via INTRAVENOUS
  Administered 2021-09-11: 40 mg via INTRAVENOUS

## 2021-09-11 SURGICAL SUPPLY — 48 items
APPLIER CLIP 5 13 M/L LIGAMAX5 (MISCELLANEOUS)
APPLIER CLIP ROT 10 11.4 M/L (STAPLE)
BAG COUNTER SPONGE SURGICOUNT (BAG) ×2 IMPLANT
BAG SURGICOUNT SPONGE COUNTING (BAG) ×1
CABLE HIGH FREQUENCY MONO STRZ (ELECTRODE) IMPLANT
CHLORAPREP W/TINT 26 (MISCELLANEOUS) ×3 IMPLANT
CLIP APPLIE 5 13 M/L LIGAMAX5 (MISCELLANEOUS) IMPLANT
CLIP APPLIE ROT 10 11.4 M/L (STAPLE) IMPLANT
COVER SURGICAL LIGHT HANDLE (MISCELLANEOUS) ×3 IMPLANT
CUTTER FLEX LINEAR 45M (STAPLE) ×3 IMPLANT
DECANTER SPIKE VIAL GLASS SM (MISCELLANEOUS) ×1 IMPLANT
DERMABOND ADVANCED (GAUZE/BANDAGES/DRESSINGS) ×2
DERMABOND ADVANCED .7 DNX12 (GAUZE/BANDAGES/DRESSINGS) ×1 IMPLANT
DRAIN CHANNEL 19F RND (DRAIN) IMPLANT
ELECT PENCIL ROCKER SW 15FT (MISCELLANEOUS) ×3 IMPLANT
ELECT REM PT RETURN 15FT ADLT (MISCELLANEOUS) ×3 IMPLANT
ENDOLOOP SUT PDS II  0 18 (SUTURE)
ENDOLOOP SUT PDS II 0 18 (SUTURE) IMPLANT
EVACUATOR SILICONE 100CC (DRAIN) IMPLANT
GLOVE SURG ENC MOIS LTX SZ7.5 (GLOVE) ×3 IMPLANT
GLOVE SURG UNDER LTX SZ8 (GLOVE) ×3 IMPLANT
GOWN STRL REUS W/TWL XL LVL3 (GOWN DISPOSABLE) ×6 IMPLANT
IRRIG SUCT STRYKERFLOW 2 WTIP (MISCELLANEOUS) ×3
IRRIGATION SUCT STRKRFLW 2 WTP (MISCELLANEOUS) ×1 IMPLANT
KIT BASIN OR (CUSTOM PROCEDURE TRAY) ×3 IMPLANT
KIT TURNOVER KIT A (KITS) IMPLANT
PAD POSITIONING PINK XL (MISCELLANEOUS) ×3 IMPLANT
POUCH SPECIMEN RETRIEVAL 10MM (ENDOMECHANICALS) ×3 IMPLANT
RELOAD 45 VASCULAR/THIN (ENDOMECHANICALS) IMPLANT
RELOAD STAPLE 45 2.5 WHT GRN (ENDOMECHANICALS) IMPLANT
RELOAD STAPLE 45 3.5 BLU ETS (ENDOMECHANICALS) IMPLANT
RELOAD STAPLE 60 3.6 BLU REG (STAPLE) IMPLANT
RELOAD STAPLE TA45 3.5 REG BLU (ENDOMECHANICALS) IMPLANT
RELOAD STAPLER BLUE 60MM (STAPLE) ×2 IMPLANT
SCISSORS LAP 5X35 DISP (ENDOMECHANICALS) IMPLANT
SET TUBE SMOKE EVAC HIGH FLOW (TUBING) ×3 IMPLANT
SHEARS HARMONIC ACE PLUS 36CM (ENDOMECHANICALS) ×3 IMPLANT
SLEEVE ADV FIXATION 5X100MM (TROCAR) ×3 IMPLANT
STAPLE ECHEON FLEX 60 POW ENDO (STAPLE) ×2 IMPLANT
STAPLER RELOAD BLUE 60MM (STAPLE) ×6
SUT ETHILON 3 0 PS 1 (SUTURE) IMPLANT
SUT MNCRL AB 4-0 PS2 18 (SUTURE) ×3 IMPLANT
TOWEL OR 17X26 10 PK STRL BLUE (TOWEL DISPOSABLE) IMPLANT
TOWEL OR NON WOVEN STRL DISP B (DISPOSABLE) ×3 IMPLANT
TRAY FOLEY MTR SLVR 14FR STAT (SET/KITS/TRAYS/PACK) ×3 IMPLANT
TRAY LAPAROSCOPIC (CUSTOM PROCEDURE TRAY) ×3 IMPLANT
TROCAR ADV FIXATION 5X100MM (TROCAR) ×3 IMPLANT
TROCAR XCEL BLUNT TIP 100MML (ENDOMECHANICALS) ×3 IMPLANT

## 2021-09-11 NOTE — Op Note (Signed)
09/11/2021 1:06 PM  PATIENT: Erika Collins  75 y.o. female  Patient Care Team: Erika Hess, MD as PCP - General (Internal Medicine) Erika Collins, Erika Peer, MD as Consulting Physician (Ophthalmology) Dr. Kallie Collins as Consulting Physician ([facility]) Erika Manifold, MD as Consulting Physician (Gastroenterology)  PRE-OPERATIVE DIAGNOSIS: Appendiceal mass  POST-OPERATIVE DIAGNOSIS: Same  PROCEDURE: Laparoscopic appendectomy  SURGEON: Erika Mt. Erika Mannan, MD  ASSISTANT: OR staff  ANESTHESIA: General endotracheal  EBL: Total I/O In: 900 [I.V.:800; IV Piggyback:100] Out: 205 [Urine:200; Blood:5]  DRAINS: None  SPECIMEN: Appendix including cuff of cecum  COUNTS: Sponge, needle and instrument counts were reported correct x2 at the conclusion of the operation  DISPOSITION: PACU in satisfactory condition  COMPLICATIONS: None  FINDINGS: Bulbous enlargement of the appendix from the midportion to the base.  Diminutive tip.  No apparent inflammation or involvement of structures around the appendix.  We are able to obtain a nice clean gross margin without narrowing the ileocecal valve but taking a small cuff of cecum with this.  Specimen was walked to pathology and opened.  There is a grossly negative margin.  DESCRIPTION:  The patient was identified & brought into the operating room. SCDs were in place and functioning. General endotracheal anesthesia was administered. Preoperative antibiotics were administered. The patient was positioned supine with left arm tucked. Hair on the abdomen was then clipped. A foley catheter was inserted under sterile conditions. The abdomen was prepped and draped in the standard sterile fashion. A surgical timeout confirmed our plan.  A small incision was made in the supraumbilical fold. The subcutaneous tissue was dissected and the umbilical stalk identified. The stalk was grasped with a Kocher and retracted outwardly. The  infraumbilical fascia was exposed and incised. Peritoneal entry was carefully made bluntly. A 0 Vicryl purse-string suture was placed and then the [facility] port was introduced into the abdomen.  CO2 insufflation commenced to 78mmHg. The laparoscope was inserted and confirmed no evidence of trocar site complications. The patient was then positioned in Trendelenburg. Two additional ports were placed - one in left lower quadrant and another in the suprapubic midline taking care to stay well above the bladder - 3 fingerbreadths above the pubic symphysis. The bed was then slightly tilted to place the left side down.  The terminal ileum was identified. This was gently mobilized sharply up to the cecum in a lateral to medial fashion. Care was taken to avoid injuring any retroperitoneal structures.  The appendix is intraperitoneal.  The tip is diminutive.  There is a bulbous extension of the midportion and base.  There are no significant adhesions around the appendix.  The cecum, terminal ileum, and ascending colon are normal appearance.  The ileocecal valve was inspected.  It appears that we will be able to successfully remove this including a cuff of cecum without significantly narrowing the ileocecal valve and still have a negative margin.  The appendiceal mesentery is ligated and divided using the harmonic scalpel.  The cecum was partially mobilized by incising the Joylynn Defrancesco line of Toldt.  With the appendix on traction, the cecum was reinspected and again appears that we will be able to get a negative margin with a cuff of cecum.  A laparoscopic 60 mm blue load Echelon stapler was used with approximately 2 firings to divide the cecum steering clear of the ileocecal valve.  The appendix was placed in an Endocatch bag and removed and passed off as specimen. The right lower quadrant was irrigated and hemostasis appreciated.  The staple line on the cecum is hemostatic and intact. There is no apparent narrowing of the  ileocecal valve.  The specimen was walked to pathology and opened by our pathologist.  It was found to have a grossly negative margin.  The left lower quadrant and suprapubic ports were removed under direct visualization and hemostatic. The CO2 was exhausted from the abdomen. The umbilical fascia was then closed by closing the 0 Vicryl suture. The fascia was palpated and noted to be completely closed. The skin of all port sites was then approximated using 4-0 Monocryl suture. The incisions were covered with Dermabond.  She was then awakened from general anesthesia, extubated, and transferred to a stretcher for transport to recover in satisfactory condition.

## 2021-09-11 NOTE — Anesthesia Preprocedure Evaluation (Addendum)
Anesthesia Evaluation  Patient identified by MRN, date of birth, ID band Patient awake    Reviewed: Allergy & Precautions, NPO status , Patient's Chart, lab work & pertinent test results  History of Anesthesia Complications Negative for: history of anesthetic complications  Airway Mallampati: II  TM Distance: >3 FB Neck ROM: Full    Dental no notable dental hx.    Pulmonary Current Smoker and Patient abstained from smoking.,    Pulmonary exam normal        Cardiovascular negative cardio ROS Normal cardiovascular exam     Neuro/Psych negative neurological ROS  negative psych ROS   GI/Hepatic Neg liver ROS, MUCOCELE OF APPENDIX   Endo/Other  negative endocrine ROS  Renal/GU negative Renal ROS  negative genitourinary   Musculoskeletal  (+) Arthritis ,   Abdominal   Peds  Hematology negative hematology ROS (+)   Anesthesia Other Findings Glaucoma  Reproductive/Obstetrics negative OB ROS                            Anesthesia Physical Anesthesia Plan  ASA: 2  Anesthesia Plan: General   Post-op Pain Management: Tylenol PO (pre-op)   Induction: Intravenous  PONV Risk Score and Plan: 3 and Ondansetron, Dexamethasone and Treatment may vary due to age or medical condition  Airway Management Planned: Oral ETT  Additional Equipment: None  Intra-op Plan:   Post-operative Plan: Extubation in OR  Informed Consent: I have reviewed the patients History and Physical, chart, labs and discussed the procedure including the risks, benefits and alternatives for the proposed anesthesia with the patient or authorized representative who has indicated his/her understanding and acceptance.     Dental advisory given  Plan Discussed with: CRNA  Anesthesia Plan Comments:        Anesthesia Quick Evaluation

## 2021-09-11 NOTE — Anesthesia Procedure Notes (Signed)
Procedure Name: Intubation Date/Time: 09/11/2021 12:00 PM Performed by: Claudia Desanctis, CRNA Pre-anesthesia Checklist: Patient identified, Emergency Drugs available, Suction available and Patient being monitored Patient Re-evaluated:Patient Re-evaluated prior to induction Oxygen Delivery Method: Circle system utilized Preoxygenation: Pre-oxygenation with 100% oxygen Induction Type: IV induction Ventilation: Mask ventilation without difficulty Laryngoscope Size: 2 and Miller Grade View: Grade I Tube type: Oral Tube size: 7.0 mm Number of attempts: 1 Airway Equipment and Method: Stylet Placement Confirmation: ETT inserted through vocal cords under direct vision, positive ETCO2 and breath sounds checked- equal and bilateral Secured at: 21 cm Tube secured with: Tape Dental Injury: Teeth and Oropharynx as per pre-operative assessment

## 2021-09-11 NOTE — Discharge Instructions (Signed)
POST OP INSTRUCTIONS  DIET: As tolerated. Follow a light bland diet the first 24 hours after arrival home, such as soup, liquids, crackers, etc.  Be sure to include lots of fluids daily.  Avoid fast food or heavy meals as your are more likely to get nauseated.  Eat a low fat the next few days after surgery.  Take your usually prescribed home medications unless otherwise directed.  PAIN CONTROL: Pain is best controlled by a usual combination of three different methods TOGETHER: Ice/Heat Over the counter pain medication Prescription pain medication Most patients will experience some swelling and bruising around the surgical site.  Ice packs or heating pads (30-60 minutes up to 6 times a day) will help. Some people prefer to use ice alone, heat alone, alternating between ice & heat.  Experiment to what works for you.  Swelling and bruising can take several weeks to resolve.   It is helpful to take an over-the-counter pain medication regularly for the first few weeks: Ibuprofen (Motrin/Advil) - 200mg tabs - take 3 tabs (600mg) every 6 hours as needed for pain Acetaminophen (Tylenol) - you may take 650mg every 6 hours as needed. You can take this with motrin as they act differently on the body. If you are taking a narcotic pain medication that has acetaminophen in it, do not take over the counter tylenol at the same time.  Iii. NOTE: You may take both of these medications together - most patients  find it most helpful when alternating between the two (i.e. Ibuprofen at 6am,  tylenol at 9am, ibuprofen at 12pm ...) A  prescription for pain medication should be given to you upon discharge.  Take your pain medication as prescribed if your pain is not adequatly controlled with the over-the-counter pain reliefs mentioned above.  Avoid getting constipated.  Between the surgery and the pain medications, it is common to experience some constipation.  Increasing fluid intake and taking a fiber supplement (such as  Metamucil, Citrucel, FiberCon, MiraLax, etc) 1-2 times a day regularly will usually help prevent this problem from occurring.  A mild laxative (prune juice, Milk of Magnesia, MiraLax, etc) should be taken according to package directions if there are no bowel movements after 48 hours.    Dressing: Your incision are covered in Dermabond which is like sterile superglue for the skin. This will come off on it's own in a couple weeks. It is waterproof and you may bathe normally starting the day after your surgery in a shower. Avoid baths/pools/lakes/oceans until your wounds have fully healed.  ACTIVITIES as tolerated:   Avoid heavy lifting (>10lbs or 1 gallon of milk) for the next 6 weeks. You may resume regular (light) daily activities beginning the next day--such as daily self-care, walking, climbing stairs--gradually increasing activities as tolerated.  If you can walk 30 minutes without difficulty, it is safe to try more intense activity such as jogging, treadmill, bicycling, low-impact aerobics.  DO NOT PUSH THROUGH PAIN.  Let pain be your guide: If it hurts to do something, don't do it. You may drive when you are no longer taking prescription pain medication, you can comfortably wear a seatbelt, and you can safely maneuver your car and apply brakes.   FOLLOW UP in our office Please call [facility] at [phone] to set up an appointment to see your surgeon in the office for a follow-up appointment approximately 2 weeks after your surgery. Make sure that you call for this appointment the day you arrive home to   insure a convenient appointment time.  9. If you have disability or family leave forms that need to be completed, you may have them completed by your primary care physician's office; for return to work instructions, please ask our office staff and they will be happy to assist you in obtaining this documentation   When to call us [phone]: Poor pain control Reactions / problems with new  medications (rash/itching, etc)  Fever over 101.5 F (38.5 C) Inability to urinate Nausea/vomiting Worsening swelling or bruising Continued bleeding from incision. Increased pain, redness, or drainage from the incision  The clinic staff is available to answer your questions during regular business hours (8:30am-5pm).  Please don't hesitate to call and ask to speak to one of our nurses for clinical concerns.   A surgeon from [facility] is always on call at the hospitals   If you have a medical emergency, go to the nearest emergency room or call 911.  [facility] [address], [address], [place], [place]  [address] MAIN: [phone] FAX: [phone] [email or web address] 

## 2021-09-11 NOTE — Anesthesia Postprocedure Evaluation (Signed)
Anesthesia Post Note  Patient: Katlyne Nishida  Procedure(s) Performed: APPENDECTOMY LAPAROSCOPIC (Abdomen)     Patient location during evaluation: PACU Anesthesia Type: General Level of consciousness: awake and alert and oriented Pain management: pain level controlled Vital Signs Assessment: post-procedure vital signs reviewed and stable Respiratory status: spontaneous breathing, nonlabored ventilation and respiratory function stable Cardiovascular status: blood pressure returned to baseline Postop Assessment: no apparent nausea or vomiting Anesthetic complications: no   No notable events documented.  Last Vitals:  Vitals:   09/11/21 1345 09/11/21 1408  BP: (!) 149/59 (!) 171/75  Pulse: (!) 57 (!) 54  Resp: 19 16  Temp:    SpO2: 95% 98%    Last Pain:  Vitals:   09/11/21 1345  TempSrc:   PainSc: 0-No pain                 Marthenia Rolling

## 2021-09-11 NOTE — Transfer of Care (Signed)
Immediate Anesthesia Transfer of Care Note  Patient: Erika Collins  Procedure(s) Performed: APPENDECTOMY LAPAROSCOPIC (Abdomen)  Patient Location: PACU  Anesthesia Type:General  Level of Consciousness: awake and patient cooperative  Airway & Oxygen Therapy: Patient Spontanous Breathing and Patient connected to nasal cannula oxygen  Post-op Assessment: Report given to RN and Post -op Vital signs reviewed and stable  Post vital signs: Reviewed and stable  Last Vitals:  Vitals Value Taken Time  BP 151/63 09/11/21 1317  Temp 36.4 C 09/11/21 1317  Pulse 64 09/11/21 1319  Resp 14 09/11/21 1319  SpO2 100 % 09/11/21 1319  Vitals shown include unvalidated device data.  Last Pain:  Vitals:   09/11/21 1129  TempSrc:   PainSc: 0-No pain      Patients Stated Pain Goal: 3 (25/36/64 4034)  Complications: No notable events documented.

## 2021-09-11 NOTE — H&P (Signed)
CC: Here today for surgery  HPI: Erika Collins is an 75 y.o. female with history of arthritis, glaucoma, TIA 25 yrs ago, vertigo remote, whom is seen in the office today as a referral by Dr. Bonna Gains for evaluation of appendix mass.   Routine colonoscopy with Dr. Bonna Gains 05/13/21 -demonstrated a protruding type lesion versus a lumen of her appendix at the appendiceal orifice, 2 small polyps that were removed, and hemorrhoids.  Path -biopsy of the appendiceal orifice lesion demonstrated colonic mucosa with focal prominent lymphoid aggregate. No dysplasia or malignancy was apparent on this biopsy. The other 2 polyps returned as tubular adenoma.  Subsequent CT A/P 06/06/21 -enlarged low-density appendix compatible with potentially being a mucocele. No evidence of rupture peritoneal disease. Sigmoid diverticulosis. Hepatic steatosis. Aortic atherosclerosis  She was referred to see [place] given these findings.   She reports she is had no symptoms related to this. She denies any history of right lower quadrant pain. She denies any blood in her stool or weight changes. She denies any nausea/vomiting/crampy abdominal pains or bloating.  PMH: arthritis, glaucoma, TIA 25 yrs ago, vertigo remote,   PSH: BTL 1970s; denies any other abdominal or pelvic surgical history  FHx: Denies any known family history of colorectal, breast, endometrial or ovarian cancer  Social Hx: Denies use of tobacco/EtOH/illicit drug. She is retired. Wishes she could go back to work but due to her arthritis and other health conditions is unable to. Has previously worked at [employer], and within the real estate sector  Review of Systems: A complete review of systems was obtained from the patient. I have reviewed this information and discussed as appropriate with the patient. See HPI as well for other ROS.  She denies any changes in her health or health history since we met in the office. No new medications/allergies. She states she is  ready for surgery today.  Past Medical History:  Diagnosis Date   Allergy    Arthritis    Glaucoma    Hx of transient ischemic attack (TIA)    over 25 yrs ago.  no deficits.   Mass of appendix    Osteoarthritis    Vertebral collapse ([place])    C3,4,5,6,7 compressed over 50 years ago   Vertigo    over 10 yrs ago    Past Surgical History:  Procedure Laterality Date   COLONOSCOPY  02/24/2011   normal, repeat 10 years   COLONOSCOPY WITH PROPOFOL N/A 05/13/2021   Procedure: COLONOSCOPY WITH PROPOFOL;  Surgeon: Virgel Manifold, MD;  Location: [place];  Service: Endoscopy;  Laterality: N/A;   EYE SURGERY  Medicated Implants Both Eyes   Retina Specialist   POLYPECTOMY  05/13/2021   Procedure: POLYPECTOMY;  Surgeon: Virgel Manifold, MD;  Location: [place];  Service: Endoscopy;;   TUBAL LIGATION      Family History  Problem Relation Age of Onset   Heart attack Father    Hyperlipidemia Sister    Breast cancer Neg Hx     Social:  reports that she has been smoking cigarettes. She has a 11.25 pack-year smoking history. She has never used smokeless tobacco. She reports current alcohol use of about 7.0 - 10.0 standard drinks per week. She reports that she does not use drugs.  Allergies: No Known Allergies  Medications: I have reviewed the patient's current medications.  No results found for this or any previous visit (from the past 48 hour(s)).  No results found.  ROS - all  of the below systems have been reviewed with the patient and positives are indicated with bold text General: chills, fever or night sweats Eyes: blurry vision or double vision ENT: epistaxis or sore throat Allergy/Immunology: itchy/watery eyes or nasal congestion Hematologic/Lymphatic: bleeding problems, blood clots or swollen lymph nodes Endocrine: temperature intolerance or unexpected weight changes Breast: new or changing breast lumps or nipple discharge Resp: cough,  shortness of breath, or wheezing CV: chest pain or dyspnea on exertion GI: as per HPI GU: dysuria, trouble voiding, or hematuria MSK: joint pain or joint stiffness Neuro: TIA or stroke symptoms Derm: pruritus and skin lesion changes Psych: anxiety and depression  PE There were no vitals taken for this visit. Constitutional: NAD; conversant; no deformities, wearing mask Eyes: Moist conjunctiva; no lid lag; anicteric Lungs: Normal respiratory effort CV: RRR GI: Abd soft, NT/ND; no palpable hepatosplenomegaly MSK: Normal range of motion of extremities Psychiatric: Appropriate affect; alert and oriented x3  No results found for this or any previous visit (from the past 48 hour(s)).  No results found.  A/P: Erika Collins is an 75 y.o. female with hx of arthritis, glaucoma, TIA 25 yrs ago, vertigo remote, here for surgery for presumed appendiceal mucocele  -The anatomy and physiology of the GI tract was reviewed with the patient. The pathophysiology of appendiceal masses and mucocele was discussed as well with associated pictures. -We have discussed various different treatment options going forward including our recommendation for surgery - laparoscopic appendectomy, scenarios where additional concurrent procedures could be necessary (including ileocecectomy vs right hemicolectomy and what this level of procedure would entail) -The planned procedure, material risks (including, but not limited to, pain, bleeding, infection, scarring, need for blood transfusion, damage to surrounding structures- blood vessels/nerves/viscus/organs, damage to ureter, leak from staple line or anastomosis, need for additional procedures, scenarios where a stoma may be necessary, worsening of pre-existing medical conditions, hernia, recurrence, pneumonia, heart attack, stroke, death) benefits and alternatives to surgery were discussed at length. The patient's questions were answered to her satisfaction, she voiced  understanding and elected to proceed with surgery. Additionally, we discussed typical postoperative expectations and the recovery process.  Nadeen Landau, MD [facility], [facility]

## 2021-09-12 ENCOUNTER — Encounter (HOSPITAL_COMMUNITY): Payer: Self-pay | Admitting: Surgery

## 2021-09-12 LAB — SURGICAL PATHOLOGY

## 2021-10-01 ENCOUNTER — Other Ambulatory Visit: Payer: Self-pay

## 2021-10-01 NOTE — Progress Notes (Signed)
The proposed treatment discussed in conference is for discussion purpose only and is not a binding recommendation.  The patients have not been physically examined, or presented with their treatment options.  Therefore, final treatment plans cannot be decided.  

## 2021-10-05 ENCOUNTER — Encounter: Payer: Self-pay | Admitting: Gastroenterology

## 2021-10-06 ENCOUNTER — Encounter: Payer: Self-pay | Admitting: Oncology

## 2021-10-13 ENCOUNTER — Other Ambulatory Visit: Payer: Medicare HMO

## 2021-10-16 DIAGNOSIS — R7989 Other specified abnormal findings of blood chemistry: Secondary | ICD-10-CM | POA: Diagnosis not present

## 2021-10-17 ENCOUNTER — Encounter: Payer: Self-pay | Admitting: Oncology

## 2021-10-22 ENCOUNTER — Other Ambulatory Visit: Payer: Self-pay

## 2021-10-22 ENCOUNTER — Inpatient Hospital Stay: Payer: Medicare HMO | Attending: Oncology | Admitting: Oncology

## 2021-10-22 DIAGNOSIS — F17210 Nicotine dependence, cigarettes, uncomplicated: Secondary | ICD-10-CM | POA: Diagnosis not present

## 2021-10-22 DIAGNOSIS — R69 Illness, unspecified: Secondary | ICD-10-CM | POA: Diagnosis not present

## 2021-10-22 DIAGNOSIS — R7989 Other specified abnormal findings of blood chemistry: Secondary | ICD-10-CM | POA: Diagnosis not present

## 2021-10-29 NOTE — Progress Notes (Signed)
I connected with Erika Collins on 10/29/21 at  2:45 PM EST by video enabled telemedicine visit and verified that I am speaking with the correct person using two identifiers.   I discussed the limitations, risks, security and privacy concerns of performing an evaluation and management service by telemedicine and the availability of in-person appointments. I also discussed with the patient that there may be a patient responsible charge related to this service. The patient expressed understanding and agreed to proceed.  Other persons participating in the visit and their role in the encounter:  none  Patient's location:  home Provider's location:  work  Risk analyst Complaint: Routine follow-up for elevated ferritin  History of present illness: Patient is a 76 year old female who was seen by Dr. Bonna Gains after CT scan finding showed fatty liver located as a part of the fatty liver work-up she had a ferritin levels checked which were elevated at 440.  No prior values available for comparison.  LFTs are normal.  HFE gene assay was done a week ago and was pending at the time of my visit.  Iron saturation and iron studies was 52%.  Patient otherwise feels well and denies any symptoms of nausea vomiting diarrhea or changes in her bowel habits.    Interval history Patient is doing well. Denies any specific complaints at this time   Review of Systems  Constitutional:  Negative for chills, fever, malaise/fatigue and weight loss.  HENT:  Negative for congestion, ear discharge and nosebleeds.   Eyes:  Negative for blurred vision.  Respiratory:  Negative for cough, hemoptysis, sputum production, shortness of breath and wheezing.   Cardiovascular:  Negative for chest pain, palpitations, orthopnea and claudication.  Gastrointestinal:  Negative for abdominal pain, blood in stool, constipation, diarrhea, heartburn, melena, nausea and vomiting.  Genitourinary:  Negative for dysuria, flank pain, frequency, hematuria and  urgency.  Musculoskeletal:  Negative for back pain, joint pain and myalgias.  Skin:  Negative for rash.  Neurological:  Negative for dizziness, tingling, focal weakness, seizures, weakness and headaches.  Endo/Heme/Allergies:  Does not bruise/bleed easily.  Psychiatric/Behavioral:  Negative for depression and suicidal ideas. The patient does not have insomnia.    No Known Allergies  Past Medical History:  Diagnosis Date   Allergy    Arthritis    Glaucoma    Hx of transient ischemic attack (TIA)    over 25 yrs ago.  no deficits.   Mass of appendix    Osteoarthritis    Vertebral collapse ([place])    C3,4,5,6,7 compressed over 50 years ago   Vertigo    over 10 yrs ago    Past Surgical History:  Procedure Laterality Date   COLONOSCOPY  02/24/2011   normal, repeat 10 years   COLONOSCOPY WITH PROPOFOL N/A 05/13/2021   Procedure: COLONOSCOPY WITH PROPOFOL;  Surgeon: Virgel Manifold, MD;  Location: [place];  Service: Endoscopy;  Laterality: N/A;   EYE SURGERY  Medicated Implants Both Eyes   Retina Specialist   LAPAROSCOPIC APPENDECTOMY N/A 09/11/2021   Procedure: APPENDECTOMY LAPAROSCOPIC;  Surgeon: Ileana Roup, MD;  Location: [facility];  Service: General;  Laterality: N/A;   POLYPECTOMY  05/13/2021   Procedure: POLYPECTOMY;  Surgeon: Virgel Manifold, MD;  Location: [place];  Service: Endoscopy;;   TUBAL LIGATION      Social History   Socioeconomic History   Marital status: Widowed    Spouse name: Not on file   Number of children: 1   Years of  education: some college   Highest education level: 12th grade  Occupational History   Occupation: Retired  Tobacco Use   Smoking status: Every Day    Packs/day: 0.25    Years: 45.00    Pack years: 11.25    Types: Cigarettes   Smokeless tobacco: Never   Tobacco comments:    3-4 cigerettes daily  Vaping Use   Vaping Use: Never used  Substance and Sexual Activity   Alcohol use: Yes     Alcohol/week: 7.0 - 10.0 standard drinks    Types: 7 - 10 Glasses of wine per week   Drug use: Never   Sexual activity: Not Currently  Other Topics Concern   Not on file  Social History Narrative   Pt lives alone.    Social Determinants of Health   Financial Resource Strain: Low Risk    Difficulty of Paying Living Expenses: Not hard at all  Food Insecurity: No Food Insecurity   Worried About Charity fundraiser in the Last Year: Never true   [place] in the Last Year: Never true  Transportation Needs: No Transportation Needs   Lack of Transportation (Medical): No   Lack of Transportation (Non-Medical): No  Physical Activity: Sufficiently Active   Days of Exercise per Week: 7 days   Minutes of Exercise per Session: 30 min  Stress: No Stress Concern Present   Feeling of Stress : Not at all  Social Connections: Socially Isolated   Frequency of Communication with Friends and Family: More than three times a week   Frequency of Social Gatherings with Friends and Family: Twice a week   Attends Religious Services: Never   Marine scientist or Organizations: No   Attends Archivist Meetings: Never   Marital Status: Widowed  Human resources officer Violence: Not At Risk   Fear of Current or Ex-Partner: No   Emotionally Abused: No   Physically Abused: No   Sexually Abused: No    Family History  Problem Relation Age of Onset   Heart attack Father    Hyperlipidemia Sister    Breast cancer Neg Hx      Current Outpatient Medications:    aspirin 81 MG tablet, Take 81 mg by mouth daily. , Disp: , Rfl:    atorvastatin (LIPITOR) 10 MG tablet, TAKE 1 TABLET BY MOUTH EVERY DAY, Disp: 90 tablet, Rfl: 2   brimonidine-timolol (COMBIGAN) 0.2-0.5 % ophthalmic solution, Place 1 drop into both eyes every 12 (twelve) hours., Disp: , Rfl:    Calcium Carbonate-Vitamin D 600-400 MG-UNIT tablet, Take 1 tablet by mouth 2 (two) times daily., Disp: , Rfl:    Omega-3 Fatty Acids (FISH  OIL) 1200 MG CAPS, Take 1,200 mg by mouth in the morning and at bedtime., Disp: , Rfl:    vitamin C (ASCORBIC ACID) 500 MG tablet, Take 500 mg by mouth daily., Disp: , Rfl:    Vitamin E 400 units TABS, Take 400 Units by mouth daily., Disp: , Rfl:   No results found.  No images are attached to the encounter.   CMP Latest Ref Rng & Units 05/28/2021  Glucose 65 - 99 mg/dL 118(H)  BUN 8 - 27 mg/dL 16  Creatinine 0.57 - 1.00 mg/dL 0.74  Sodium 134 - 144 mmol/L 141  Potassium 3.5 - 5.2 mmol/L 4.7  Chloride 96 - 106 mmol/L 102  CO2 20 - 29 mmol/L 25  Calcium 8.7 - 10.3 mg/dL 10.2  Total Protein 6.0 - 8.5  g/dL 7.5  Total Bilirubin 0.0 - 1.2 mg/dL 0.4  Alkaline Phos 44 - 121 IU/L 70  AST 0 - 40 IU/L 25  ALT 0 - 32 IU/L 27   CBC Latest Ref Rng & Units 08/26/2021  WBC 4.0 - 10.5 K/uL 6.4  Hemoglobin 12.0 - 15.0 g/dL 12.9  Hematocrit 36.0 - 46.0 % 39.4  Platelets 150 - 400 K/uL 153     Observation/objective:appears in no acute distress over video visit today. Breathing is non labored  Assessment and plan:Patient is a 76 year old female referred for elevated ferritin  Elevated ferritin- HFE gene testing negative. Iron saturation 52%. Ferritin is mildly elevated at 440 in oct 2022 which on repeat check in jan 2023 was 265 which is within normal limits.   Follow-up instructions:cbc cmp ferritin in 6 months and see me  I discussed the assessment and treatment plan with the patient. The patient was provided an opportunity to ask questions and all were answered. The patient agreed with the plan and demonstrated an understanding of the instructions.   The patient was advised to call back or seek an in-person evaluation if the symptoms worsen or if the condition fails to improve as anticipated.   Visit Diagnosis: 1. Elevated ferritin     Dr. Randa Evens, MD, MPH [facility] Tel- [phone] 10/29/2021 9:11 AM

## 2021-10-30 ENCOUNTER — Encounter: Payer: Self-pay | Admitting: Oncology

## 2021-11-11 DIAGNOSIS — H44113 Panuveitis, bilateral: Secondary | ICD-10-CM | POA: Diagnosis not present

## 2021-11-11 DIAGNOSIS — H35373 Puckering of macula, bilateral: Secondary | ICD-10-CM | POA: Diagnosis not present

## 2021-11-11 DIAGNOSIS — H44002 Unspecified purulent endophthalmitis, left eye: Secondary | ICD-10-CM | POA: Diagnosis not present

## 2021-12-05 DIAGNOSIS — H4043X3 Glaucoma secondary to eye inflammation, bilateral, severe stage: Secondary | ICD-10-CM | POA: Diagnosis not present

## 2021-12-05 DIAGNOSIS — H43811 Vitreous degeneration, right eye: Secondary | ICD-10-CM | POA: Diagnosis not present

## 2021-12-05 DIAGNOSIS — Z961 Presence of intraocular lens: Secondary | ICD-10-CM | POA: Diagnosis not present

## 2022-02-25 ENCOUNTER — Other Ambulatory Visit: Payer: Self-pay | Admitting: Internal Medicine

## 2022-02-25 DIAGNOSIS — Z1231 Encounter for screening mammogram for malignant neoplasm of breast: Secondary | ICD-10-CM

## 2022-03-06 DIAGNOSIS — Z961 Presence of intraocular lens: Secondary | ICD-10-CM | POA: Diagnosis not present

## 2022-03-06 DIAGNOSIS — H4041X3 Glaucoma secondary to eye inflammation, right eye, severe stage: Secondary | ICD-10-CM | POA: Diagnosis not present

## 2022-03-06 DIAGNOSIS — H4042X1 Glaucoma secondary to eye inflammation, left eye, mild stage: Secondary | ICD-10-CM | POA: Diagnosis not present

## 2022-04-02 ENCOUNTER — Ambulatory Visit
Admission: RE | Admit: 2022-04-02 | Discharge: 2022-04-02 | Disposition: A | Discharge status: Home / Self Care | Payer: Medicare HMO | Source: Ambulatory Visit | Attending: Internal Medicine | Admitting: Internal Medicine

## 2022-04-02 DIAGNOSIS — Z1231 Encounter for screening mammogram for malignant neoplasm of breast: Secondary | ICD-10-CM | POA: Diagnosis not present

## 2022-05-08 ENCOUNTER — Other Ambulatory Visit: Payer: Self-pay | Admitting: Internal Medicine

## 2022-05-08 DIAGNOSIS — E785 Hyperlipidemia, unspecified: Secondary | ICD-10-CM

## 2022-05-08 NOTE — Telephone Encounter (Signed)
Requested Prescriptions  Pending Prescriptions Disp Refills  . atorvastatin (LIPITOR) 10 MG tablet [Pharmacy Med Name: ATORVASTATIN 10 MG TABLET] 90 tablet 2    Sig: TAKE 1 TABLET BY MOUTH EVERY DAY     Cardiovascular:  Antilipid - Statins Failed - 05/08/2022  2:23 AM      Failed - Lipid Panel in normal range within the last 12 months    Cholesterol, Total  Date Value Ref Range Status  05/28/2021 157 100 - 199 mg/dL Final   LDL Chol Calc (NIH)  Date Value Ref Range Status  05/28/2021 90 0 - 99 mg/dL Final   HDL  Date Value Ref Range Status  05/28/2021 45 >39 mg/dL Final   Triglycerides  Date Value Ref Range Status  05/28/2021 125 0 - 149 mg/dL Final         Passed - Patient is not pregnant      Passed - Valid encounter within last 12 months    Recent Outpatient Visits          11 months ago Annual physical exam   [place] [facility] at [facility], Jesse Sans, MD   1 year ago Mixed hyperlipidemia   [place] [facility] at [facility], Jesse Sans, MD   1 year ago Annual physical exam   [place] [facility] at [facility], Jesse Sans, MD   2 years ago Cervical disc disorder with radiculopathy   [place] [facility] at [facility], Jesse Sans, MD   2 years ago Annual physical exam   [facility] at [facility], Jesse Sans, MD      Future Appointments            In 3 weeks Army Melia Jesse Sans, MD [place] [facility] at [facility]

## 2022-05-12 DIAGNOSIS — H44113 Panuveitis, bilateral: Secondary | ICD-10-CM | POA: Diagnosis not present

## 2022-05-12 DIAGNOSIS — H35373 Puckering of macula, bilateral: Secondary | ICD-10-CM | POA: Diagnosis not present

## 2022-05-27 ENCOUNTER — Ambulatory Visit (INDEPENDENT_AMBULATORY_CARE_PROVIDER_SITE_OTHER): Payer: Medicare HMO

## 2022-05-27 VITALS — BP 132/78 | HR 68 | Ht 61.0 in | Wt 131.0 lb

## 2022-05-27 DIAGNOSIS — Z0000 Encounter for general adult medical examination without abnormal findings: Secondary | ICD-10-CM | POA: Diagnosis not present

## 2022-05-27 NOTE — Progress Notes (Signed)
Subjective:   Erika Collins is a 76 y.o. female who presents for Medicare Annual (Subsequent) preventive examination.  I connected with  Erika Collins on 05/27/22 by an in person visit and verified that I am speaking with the correct person using two identifiers.  Patient Location: Other:  In office  Provider Location: Office/Clinic  I discussed the limitations of evaluation and management by telemedicine. The patient expressed understanding and agreed to proceed.   Review of Systems    Defer to PCP Cardiac Risk Factors include: advanced age (>78mn, >>74women)     Objective:    Today's Vitals   05/27/22 1115  BP: 132/78  Pulse: 68  SpO2: 97%  Weight: 131 lb (59.4 kg)  Height: '5\' 1"'$  (1.549 m)  PainSc: 0-No pain   Body mass index is 24.75 kg/m.     05/27/2022   11:24 AM 08/26/2021    1:17 PM 07/23/2021    1:55 PM 05/21/2021   11:35 AM 05/13/2021    7:43 AM 05/17/2019   11:24 AM 05/11/2018   11:22 AM  Advanced Directives  Does Patient Have a Medical Advance Directive? Yes Yes Yes Yes Yes Yes No  Type of ACorporate treasurerof ATreasure IslandLiving will [place]Living will [place]Living will [place]Living will [place]Living will   Does patient want to make changes to medical advance directive? No - Patient declined  No - Patient declined  No - Patient declined    Copy of [place] Chart?  Yes - validated most recent copy scanned in chart (See row information)  Yes - validated most recent copy scanned in chart (See row information) Yes - validated most recent copy scanned in chart (See row information) Yes - validated most recent copy scanned in chart (See row information)   Would patient like information on creating a medical advance directive?       Yes (MAU/Ambulatory/Procedural Areas - Information given)    Current Medications (verified) Outpatient Encounter  Medications as of 05/27/2022  Medication Sig   aspirin 81 MG tablet Take 81 mg by mouth daily.    atorvastatin (LIPITOR) 10 MG tablet TAKE 1 TABLET BY MOUTH EVERY DAY   brimonidine-timolol (COMBIGAN) 0.2-0.5 % ophthalmic solution Place 1 drop into both eyes every 12 (twelve) hours.   Calcium Carbonate-Vitamin D 600-400 MG-UNIT tablet Take 1 tablet by mouth 2 (two) times daily.   Omega-3 Fatty Acids (FISH OIL) 1200 MG CAPS Take 1,200 mg by mouth in the morning and at bedtime.   vitamin C (ASCORBIC ACID) 500 MG tablet Take 500 mg by mouth daily.   Vitamin E 400 units TABS Take 400 Units by mouth daily.   No facility-administered encounter medications on file as of 05/27/2022.    Allergies (verified) Patient has no known allergies.   History: Past Medical History:  Diagnosis Date   Allergy    Arthritis    Glaucoma    Hx of transient ischemic attack (TIA)    over 25 yrs ago.  no deficits.   Mass of appendix    Osteoarthritis    [place]    C3,4,5,6,7 compressed over 50 years ago   Vertigo    over 10 yrs ago   Past Surgical History:  Procedure Laterality Date   COLONOSCOPY  02/24/2011   normal, repeat 10 years   COLONOSCOPY WITH PROPOFOL N/A 05/13/2021   Procedure: COLONOSCOPY WITH PROPOFOL;  Surgeon: Virgel Manifold, MD;  Location: [place];  Service: Endoscopy;  Laterality: N/A;   EYE SURGERY  Medicated Implants Both Eyes   Retina Specialist   LAPAROSCOPIC APPENDECTOMY N/A 09/11/2021   Procedure: APPENDECTOMY LAPAROSCOPIC;  Surgeon: Ileana Roup, MD;  Location: [facility];  Service: General;  Laterality: N/A;   POLYPECTOMY  05/13/2021   Procedure: POLYPECTOMY;  Surgeon: Virgel Manifold, MD;  Location: [place];  Service: Endoscopy;;   TUBAL LIGATION     Family History  Problem Relation Age of Onset   Heart attack Father    Hyperlipidemia Sister    Breast cancer Neg Hx    Social History   Socioeconomic History   Marital  status: Widowed    Spouse name: Not on file   Number of children: 1   Years of education: some college   Highest education level: 12th grade  Occupational History   Occupation: Retired  Tobacco Use   Smoking status: Every Day    Packs/day: 0.25    Years: 45.00    Total pack years: 11.25    Types: Cigarettes    Passive exposure: Current   Smokeless tobacco: Never   Tobacco comments:    3-4 cigerettes daily  Vaping Use   Vaping Use: Never used  Substance and Sexual Activity   Alcohol use: Yes    Alcohol/week: 7.0 - 10.0 standard drinks of alcohol    Types: 7 - 10 Glasses of wine per week   Drug use: Never   Sexual activity: Not Currently  Other Topics Concern   Not on file  Social History Narrative   Pt lives alone.    Social Determinants of Health   Financial Resource Strain: Low Risk  (05/27/2022)   Overall Financial Resource Strain (CARDIA)    Difficulty of Paying Living Expenses: Not hard at all  Food Insecurity: No Food Insecurity (05/27/2022)   Hunger Vital Sign    Worried About Running Out of Food in the Last Year: Never true    Ran Out of Food in the Last Year: Never true  Transportation Needs: No Transportation Needs (05/27/2022)   PRAPARE - Hydrologist (Medical): No    Lack of Transportation (Non-Medical): No  Physical Activity: Sufficiently Active (05/27/2022)   Exercise Vital Sign    Days of Exercise per Week: 7 days    Minutes of Exercise per Session: 60 min  Stress: No Stress Concern Present (05/27/2022)   [place]    Feeling of Stress : Not at all  Social Connections: Moderately Isolated (05/27/2022)   Social Connection and Isolation Panel [NHANES]    Frequency of Communication with Friends and Family: More than three times a week    Frequency of Social Gatherings with Friends and Family: Twice a week    Attends Religious Services: Never    Marine scientist or  Organizations: Yes    Attends Archivist Meetings: Never    Marital Status: Widowed    Tobacco Counseling Ready to quit: No Counseling given: Yes Tobacco comments: 3-4 cigerettes daily   Clinical Intake:  Pre-visit preparation completed: Yes  Pain : No/denies pain Pain Score: 0-No pain     BMI - recorded: 24.75 Nutritional Status: BMI of 19-24  Normal Nutritional Risks: None Diabetes: No  How often do you need to have someone help you when you read instructions, pamphlets, or other written materials from  your doctor or pharmacy?: (P) 1 - Never  Diabetic? No.  Interpreter Needed?: No  Information entered by :: Wyatt Haste, [place] of Daily Living    05/27/2022   11:25 AM 05/26/2022    1:23 PM  In your present state of health, do you have any difficulty performing the following activities:  Hearing? 0 0  Vision? 0 0  Difficulty concentrating or making decisions? 0 0  Walking or climbing stairs? 0 0  Dressing or bathing? 0 0  Doing errands, shopping? 0 0  Preparing Food and eating ? N N  Using the Toilet? N N  In the past six months, have you accidently leaked urine? N N  Do you have problems with loss of bowel control? N N  Managing your Medications? N N  Managing your Finances? N N  Housekeeping or managing your Housekeeping? N N    Patient Care Team: Glean Hess, MD as PCP - General (Internal Medicine) Karren Burly Deirdre Peer, MD as Consulting Physician (Ophthalmology) Dr. Kallie Locks as Consulting Physician ([facility]) Virgel Manifold, MD (Inactive) as Consulting Physician (Gastroenterology)  Indicate any recent Medical Services you may have received from other than [facility] providers in the past year (date may be approximate).     Assessment:   This is a routine wellness examination for Erika Collins.  Hearing/Vision screen Hearing Screening - Comments:: No concerns. Vision Screening - Comments:: Patient has  implants for vision.   Dietary issues and exercise activities discussed: Current Exercise Habits: Home exercise routine, Time (Minutes): > 60, Frequency (Times/Week): 7, Weekly Exercise (Minutes/Week): 0, Intensity: Moderate   Goals Addressed   None   Depression Screen    05/27/2022   11:23 AM 05/28/2021    8:42 AM 05/21/2021   11:33 AM 09/24/2020    8:36 AM 05/20/2020   11:29 AM 10/11/2019    3:14 PM 05/17/2019   11:29 AM  PHQ 2/9 Scores  PHQ - 2 Score 0 0 0 0 0 0 1  PHQ- 9 Score 2 0 0 1   2    Fall Risk    05/27/2022   11:25 AM 05/26/2022    1:23 PM 05/28/2021    8:42 AM 05/21/2021   11:36 AM 09/24/2020    8:36 AM  Fall Risk   Falls in the past year? 1 0 1 1 0  Number falls in past yr: 0 0 0 0   Injury with Fall? 0 0 0 0   Risk for fall due to : No Fall Risks   No Fall Risks   Follow up Falls evaluation completed  Falls evaluation completed Falls prevention discussed Falls evaluation completed    [place]:  Any stairs in or around the home? No  If so, are there any without handrails?  N/A Home free of loose throw rugs in walkways, pet beds, electrical cords, etc? Yes  Adequate lighting in your home to reduce risk of falls? Yes   ASSISTIVE DEVICES UTILIZED TO PREVENT FALLS:  Life alert? No  Use of a cane, walker or w/c? No  Grab bars in the bathroom? Yes  Shower chair or bench in shower? Yes  Elevated toilet seat or a handicapped toilet? Yes   TIMED UP AND GO:  Was the test performed? Yes .   Gait steady and fast without use of assistive device  Cognitive Function:        05/27/2022   11:26 AM 05/20/2020  11:34 AM 05/17/2019   11:33 AM 05/11/2018   11:24 AM  6CIT Screen  What Year? 0 points 0 points 0 points 0 points  What month? 0 points 0 points 0 points 0 points  What time? 0 points 0 points 0 points 0 points  Count back from 20 0 points 0 points 0 points 0 points  Months in reverse 0 points 0 points 0 points 0 points  Repeat  phrase 0 points 0 points 2 points 0 points  Total Score 0 points 0 points 2 points 0 points    Immunizations Immunization History  Administered Date(s) Administered   Moderna Covid-19 Vaccine Bivalent Booster 61yr & up 08/21/2021   Moderna SARS-COV2 Booster Vaccination 08/21/2021   PFIZER(Purple Top)SARS-COV-2 Vaccination 11/10/2019, 12/01/2019, 08/12/2020   Pneumococcal Conjugate-13 05/19/2019   Pneumococcal Polysaccharide-23 05/17/2018   Tdap 01/17/2020    TDAP status: Up to date  Flu Vaccine status: Declined, Education has been provided regarding the importance of this vaccine but patient still declined. Advised may receive this vaccine at local pharmacy or Health Dept. Aware to provide a copy of the vaccination record if obtained from local pharmacy or Health Dept. Verbalized acceptance and understanding.  Pneumococcal vaccine status: Up to date  Covid-19 vaccine status: Completed vaccines  Qualifies for Shingles Vaccine? Yes   Zostavax completed No   Shingrix Completed?: No.    Education has been provided regarding the importance of this vaccine. Patient has been advised to call insurance company to determine out of pocket expense if they have not yet received this vaccine. Advised may also receive vaccine at local pharmacy or Health Dept. Verbalized acceptance and understanding.  Screening Tests Health Maintenance  Topic Date Due   Zoster Vaccines- Shingrix (1 of 2) Never done   COVID-19 Vaccine (5 - Pfizer series) 06/12/2022 (Originally 12/20/2021)   INFLUENZA VACCINE  12/20/2022 (Originally 04/21/2022)   MAMMOGRAM  04/03/2023   TETANUS/TDAP  01/16/2030   Pneumonia Vaccine 76 Years old  Completed   DEXA SCAN  Completed   Hepatitis C Screening  Completed   HPV VACCINES  Aged Out   COLONOSCOPY (Pts 45-467yrInsurance coverage will need to be confirmed)  Discontinued    Health Maintenance  Health Maintenance Due  Topic Date Due   Zoster Vaccines- Shingrix (1 of 2)  Never done    Colorectal cancer screening: No longer required.   Mammogram status: Completed 04/02/2022. Repeat every year  Bone Density status: Completed 06/10/2020. Results reflect: Bone density results: OSTEOPENIA. Repeat every 3 years.  Lung Cancer Screening: (Low Dose CT Chest recommended if Age 364-80ears, 30 pack-year currently smoking OR have quit w/in 15years.) DOES qualify.   Lung Cancer Screening Referral: Patient Declined   Additional Screening:  Hepatitis C Screening: does qualify; Completed 07/07/2021  Vision Screening: Recommended annual ophthalmology exams for early detection of glaucoma and other disorders of the eye. Is the patient up to date with their annual eye exam?  Yes  Who is the provider or what is the name of the office in which the patient attends annual eye exams? [facility] not [facility] a provider, would they like to be referred to a provider to establish care? No .   Dental Screening: Recommended annual dental exams for proper oral hygiene  Community Resource Referral / Chronic Care Management: CRR required this visit?  No   CCM required this visit?  No      Plan:     I  have personally reviewed and noted the following in the patient's chart:   Medical and social history Use of alcohol, tobacco or illicit drugs  Current medications and supplements including opioid prescriptions. Patient is not currently taking opioid prescriptions. Functional ability and status Nutritional status Physical activity Advanced directives List of other physicians Hospitalizations, surgeries, and ER visits in previous 12 months Vitals Screenings to include cognitive, depression, and falls Referrals and appointments  In addition, I have reviewed and discussed with patient certain preventive protocols, quality metrics, and best practice recommendations. A written personalized care plan for preventive services as well as general  preventive health recommendations were provided to patient.     Clista Bernhardt, [place]   05/27/2022   Nurse Notes: None.

## 2022-06-01 ENCOUNTER — Encounter: Payer: Self-pay | Admitting: Internal Medicine

## 2022-06-01 ENCOUNTER — Ambulatory Visit (INDEPENDENT_AMBULATORY_CARE_PROVIDER_SITE_OTHER): Payer: Medicare HMO | Admitting: Internal Medicine

## 2022-06-01 VITALS — BP 112/60 | HR 57 | Ht 61.0 in | Wt 129.0 lb

## 2022-06-01 DIAGNOSIS — E782 Mixed hyperlipidemia: Secondary | ICD-10-CM

## 2022-06-01 DIAGNOSIS — R7989 Other specified abnormal findings of blood chemistry: Secondary | ICD-10-CM | POA: Diagnosis not present

## 2022-06-01 DIAGNOSIS — D7589 Other specified diseases of blood and blood-forming organs: Secondary | ICD-10-CM

## 2022-06-01 DIAGNOSIS — Z0000 Encounter for general adult medical examination without abnormal findings: Secondary | ICD-10-CM

## 2022-06-01 DIAGNOSIS — R7303 Prediabetes: Secondary | ICD-10-CM

## 2022-06-01 NOTE — Progress Notes (Signed)
Date:  06/01/2022   Name:  Erika Collins   DOB:  1946-01-08   MRN:  [id number]   Chief Complaint: No chief complaint on file. Erika Collins is a 76 y.o. female who presents today for her Complete Annual Exam. She feels well. She reports exercising- none. She reports she is sleeping fairly well. Breast complaints - none. She has adopted 2 cats and is enjoying the company.  Mammogram: 03/2022 DEXA: 05/2020 osteopenia Pap smear: discontinued Colonoscopy: 04/2021  Health Maintenance Due  Topic Date Due   Zoster Vaccines- Shingrix (1 of 2) Never done    Immunization History  Administered Date(s) Administered   Moderna Covid-19 Vaccine Bivalent Booster 13yr & up 08/21/2021   Moderna SARS-COV2 Booster Vaccination 08/21/2021   PFIZER(Purple Top)SARS-COV-2 Vaccination 11/10/2019, 12/01/2019, 08/12/2020   Pneumococcal Conjugate-13 05/19/2019   Pneumococcal Polysaccharide-23 05/17/2018   Tdap 01/17/2020    Hyperlipidemia This is a chronic problem. The problem is controlled. Pertinent negatives include no chest pain or shortness of breath. Current antihyperlipidemic treatment includes statins. The current treatment provides significant improvement of lipids.    Lab Results  Component Value Date   NA 141 05/28/2021   K 4.7 05/28/2021   CO2 25 05/28/2021   GLUCOSE 118 (H) 05/28/2021   BUN 16 05/28/2021   CREATININE 0.74 05/28/2021   CALCIUM 10.2 05/28/2021   EGFR 84 05/28/2021   GFRNONAA 75 09/24/2020   Lab Results  Component Value Date   CHOL 157 05/28/2021   HDL 45 05/28/2021   LDLCALC 90 05/28/2021   TRIG 125 05/28/2021   CHOLHDL 3.5 05/28/2021   Lab Results  Component Value Date   TSH 1.160 05/19/2019   Lab Results  Component Value Date   HGBA1C 6.0 (H) 05/28/2021   Lab Results  Component Value Date   WBC 6.4 08/26/2021   HGB 12.9 08/26/2021   HCT 39.4 08/26/2021   MCV 100.3 (H) 08/26/2021   PLT 153 08/26/2021   Lab Results  Component Value Date   ALT 27  05/28/2021   AST 25 05/28/2021   ALKPHOS 70 05/28/2021   BILITOT 0.4 05/28/2021   Lab Results  Component Value Date   VD25OH 32.3 05/28/2021     Review of Systems  Constitutional:  Negative for chills, fatigue and fever.  HENT:  Negative for congestion, hearing loss, tinnitus, trouble swallowing and voice change.   Eyes:  Negative for visual disturbance.  Respiratory:  Negative for cough, chest tightness, shortness of breath and wheezing.   Cardiovascular:  Negative for chest pain, palpitations and leg swelling.  Gastrointestinal:  Negative for abdominal pain, constipation, diarrhea and vomiting.  Endocrine: Negative for polydipsia and polyuria.  Genitourinary:  Negative for dysuria, frequency, genital sores, vaginal bleeding and vaginal discharge.  Musculoskeletal:  Negative for arthralgias, gait problem and joint swelling.  Skin:  Negative for color change and rash.  Neurological:  Negative for dizziness, tremors, light-headedness and headaches.  Hematological:  Negative for adenopathy. Does not bruise/bleed easily.  Psychiatric/Behavioral:  Negative for dysphoric mood and sleep disturbance. The patient is not nervous/anxious.     Patient Active Problem List   Diagnosis Date Noted   Polyp of transverse colon    Chronic bilateral low back pain with left-sided sciatica 10/11/2019   OA (osteoarthritis) of finger, unspecified laterality 05/19/2019   Numbness and tingling of foot 05/19/2019   Cervical disc disorder with radiculopathy 05/17/2018   History of TIA (transient ischemic attack) 05/17/2018   Vitamin D deficiency 05/17/2018  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   Mixed hyperlipidemia 12/13/2017   Tobacco use 12/13/2017   Primary open angle glaucoma 01/13/2013    No Known Allergies  Past Surgical History:  Procedure Laterality Date   COLONOSCOPY  02/24/2011   normal, repeat 10 years   COLONOSCOPY WITH PROPOFOL N/A 05/13/2021   Procedure: COLONOSCOPY WITH PROPOFOL;   Surgeon: Virgel Manifold, MD;  Location: [place];  Service: Endoscopy;  Laterality: N/A;   EYE SURGERY  Medicated Implants Both Eyes   Retina Specialist   LAPAROSCOPIC APPENDECTOMY N/A 09/11/2021   Procedure: APPENDECTOMY LAPAROSCOPIC;  Surgeon: Ileana Roup, MD;  Location: [facility];  Service: General;  Laterality: N/A;   POLYPECTOMY  05/13/2021   Procedure: POLYPECTOMY;  Surgeon: Virgel Manifold, MD;  Location: [place];  Service: Endoscopy;;   TUBAL LIGATION      Social History   Tobacco Use   Smoking status: Every Day    Packs/day: 0.25    Years: 45.00    Total pack years: 11.25    Types: Cigarettes    Passive exposure: Current   Smokeless tobacco: Never   Tobacco comments:    3-4 cigerettes daily  Vaping Use   Vaping Use: Never used  Substance Use Topics   Alcohol use: Yes    Alcohol/week: 7.0 - 10.0 standard drinks of alcohol    Types: 7 - 10 Glasses of wine per week   Drug use: Never     Medication list has been reviewed and updated.  Current Meds  Medication Sig   aspirin 81 MG tablet Take 81 mg by mouth daily.    atorvastatin (LIPITOR) 10 MG tablet TAKE 1 TABLET BY MOUTH EVERY DAY   brimonidine-timolol (COMBIGAN) 0.2-0.5 % ophthalmic solution Place 1 drop into both eyes every 12 (twelve) hours.   Calcium Carbonate-Vitamin D 600-400 MG-UNIT tablet Take 1 tablet by mouth 2 (two) times daily.   Omega-3 Fatty Acids (FISH OIL) 1200 MG CAPS Take 1,200 mg by mouth in the morning and at bedtime.   vitamin C (ASCORBIC ACID) 500 MG tablet Take 500 mg by mouth daily.   Vitamin E 400 units TABS Take 400 Units by mouth daily.       06/01/2022    8:44 AM 05/28/2021    8:42 AM 09/24/2020    8:36 AM  GAD 7 : Generalized Anxiety Score  Nervous, Anxious, on Edge 0 0 0  Control/stop worrying 0 0 0  Worry too much - different things 0 0 0  Trouble relaxing 0 0 0  Restless 0 0 0  Easily annoyed or irritable 0 0 0  Afraid - awful might  happen 0 0 0  Total GAD 7 Score 0 0 0  Anxiety Difficulty Not difficult at all Not difficult at all        06/01/2022    8:44 AM 05/27/2022   11:23 AM 05/28/2021    8:42 AM  Depression screen PHQ 2/9  Decreased Interest 0 0 0  Down, Depressed, Hopeless 0 0 0  PHQ - 2 Score 0 0 0  Altered sleeping 1 1 0  Tired, decreased energy 0 1 0  Change in appetite 0 0 0  Feeling bad or failure about yourself  0 0 0  Trouble concentrating 0 0 0  Moving slowly or fidgety/restless 0 0 0  Suicidal thoughts 0 0 0  PHQ-9 Score 1 2 0  Difficult doing work/chores Not difficult at all Not difficult at  all Not difficult at all    BP Readings from Last 3 Encounters:  06/01/22 112/60  05/27/22 132/78  09/11/21 (!) 153/60    Physical Exam Vitals and nursing note reviewed.  Constitutional:      General: She is not in acute distress.    Appearance: She is well-developed.  HENT:     Head: Normocephalic and atraumatic.     Right Ear: Tympanic membrane and ear canal normal.     Left Ear: Tympanic membrane and ear canal normal.     Nose:     Right Sinus: No maxillary sinus tenderness.     Left Sinus: No maxillary sinus tenderness.  Eyes:     General: No scleral icterus.       Right eye: No discharge.        Left eye: No discharge.     Conjunctiva/sclera: Conjunctivae normal.  Neck:     Thyroid: No thyromegaly.     Vascular: No carotid bruit.  Cardiovascular:     Rate and Rhythm: Normal rate and regular rhythm.     Pulses: Normal pulses.     Heart sounds: Normal heart sounds.  Pulmonary:     Effort: Pulmonary effort is normal. No respiratory distress.     Breath sounds: No wheezing.  Chest:  Breasts:    Right: No mass, nipple discharge, skin change or tenderness.     Left: No mass, nipple discharge, skin change or tenderness.  Abdominal:     General: Bowel sounds are normal.     Palpations: Abdomen is soft.     Tenderness: There is no abdominal tenderness.  Musculoskeletal:     Cervical  back: Normal range of motion. No erythema.     Right lower leg: No edema.     Left lower leg: No edema.  Lymphadenopathy:     Cervical: No cervical adenopathy.  Skin:    General: Skin is warm and dry.     Findings: No rash.  Neurological:     Mental Status: She is alert and oriented to person, place, and time.     Cranial Nerves: No cranial nerve deficit.     Sensory: No sensory deficit.     Deep Tendon Reflexes: Reflexes are normal and symmetric.  Psychiatric:        Attention and Perception: Attention normal.        Mood and Affect: Mood normal.     Wt Readings from Last 3 Encounters:  06/01/22 129 lb (58.5 kg)  05/27/22 131 lb (59.4 kg)  09/11/21 130 lb 4 oz (59.1 kg)    BP 112/60   Pulse (!) 57   Ht '5\' 1"'  (1.549 m)   Wt 129 lb (58.5 kg)   SpO2 97%   BMI 24.37 kg/m   Assessment and Plan: 1. Annual physical exam Normal exam. Up to date on screenings and immunizations. Declines flu vaccine/Shingrix  2. Prediabetes Check labs and advise Low carb diet - Comprehensive metabolic panel - Hemoglobin A1c - TSH  3. Mixed hyperlipidemia Tolerating statin medication without side effects at this time Continue same therapy without change at this time. - Lipid panel  4. Macrocytosis without anemia Recheck CBC - CBC with Differential/Platelet  5. Elevated ferritin Followed by Heme Due for repeat iron and Ferritin - Iron, TIBC and Ferritin Panel   Partially dictated using Editor, commissioning. Any errors are unintentional.  Halina Maidens, MD [facility]  06/01/2022

## 2022-06-02 LAB — COMPREHENSIVE METABOLIC PANEL
ALT: 27 IU/L (ref 0–32)
AST: 22 IU/L (ref 0–40)
Albumin/Globulin Ratio: 1.3 (ref 1.2–2.2)
Albumin: 4.1 g/dL (ref 3.8–4.8)
Alkaline Phosphatase: 71 IU/L (ref 44–121)
BUN/Creatinine Ratio: 21 (ref 12–28)
BUN: 12 mg/dL (ref 8–27)
Bilirubin Total: 0.6 mg/dL (ref 0.0–1.2)
CO2: 24 mmol/L (ref 20–29)
Calcium: 10.5 mg/dL — ABNORMAL HIGH (ref 8.7–10.3)
Chloride: 104 mmol/L (ref 96–106)
Creatinine, Ser: 0.56 mg/dL — ABNORMAL LOW (ref 0.57–1.00)
Globulin, Total: 3.2 g/dL (ref 1.5–4.5)
Glucose: 99 mg/dL (ref 70–99)
Potassium: 4.9 mmol/L (ref 3.5–5.2)
Sodium: 142 mmol/L (ref 134–144)
Total Protein: 7.3 g/dL (ref 6.0–8.5)
eGFR: 95 mL/min/{1.73_m2} (ref >59)

## 2022-06-02 LAB — LIPID PANEL
Chol/HDL Ratio: 3.8 ratio (ref 0.0–4.4)
Cholesterol, Total: 155 mg/dL (ref 100–199)
HDL: 41 mg/dL (ref >39)
LDL Chol Calc (NIH): 91 mg/dL (ref 0–99)
Triglycerides: 126 mg/dL (ref 0–149)
VLDL Cholesterol Cal: 23 mg/dL (ref 5–40)

## 2022-06-02 LAB — IRON,TIBC AND FERRITIN PANEL
Ferritin: 422 ng/mL — ABNORMAL HIGH (ref 15–150)
Iron Saturation: 52 % (ref 15–55)
Iron: 151 ug/dL — ABNORMAL HIGH (ref 27–139)
Total Iron Binding Capacity: 292 ug/dL (ref 250–450)
UIBC: 141 ug/dL (ref 118–369)

## 2022-06-02 LAB — CBC WITH DIFFERENTIAL/PLATELET
Basophils Absolute: 0 10*3/uL (ref 0.0–0.2)
Basos: 1 %
EOS (ABSOLUTE): 0.4 10*3/uL (ref 0.0–0.4)
Eos: 7 %
Hematocrit: 37.9 % (ref 34.0–46.6)
Hemoglobin: 12.8 g/dL (ref 11.1–15.9)
Immature Grans (Abs): 0 10*3/uL (ref 0.0–0.1)
Immature Granulocytes: 0 %
Lymphocytes Absolute: 1.1 10*3/uL (ref 0.7–3.1)
Lymphs: 21 %
MCH: 33.5 pg — ABNORMAL HIGH (ref 26.6–33.0)
MCHC: 33.8 g/dL (ref 31.5–35.7)
MCV: 99 fL — ABNORMAL HIGH (ref 79–97)
Monocytes Absolute: 0.6 10*3/uL (ref 0.1–0.9)
Monocytes: 10 %
Neutrophils Absolute: 3.3 10*3/uL (ref 1.4–7.0)
Neutrophils: 61 %
Platelets: 158 10*3/uL (ref 150–450)
RBC: 3.82 x10E6/uL (ref 3.77–5.28)
RDW: 12.2 % (ref 11.7–15.4)
WBC: 5.4 10*3/uL (ref 3.4–10.8)

## 2022-06-02 LAB — TSH: TSH: 1.13 u[IU]/mL (ref 0.450–4.500)

## 2022-06-02 LAB — HEMOGLOBIN A1C
Est. average glucose Bld gHb Est-mCnc: 114 mg/dL
Hgb A1c MFr Bld: 5.6 % (ref 4.8–5.6)

## 2022-06-03 ENCOUNTER — Encounter: Payer: Self-pay | Admitting: Internal Medicine

## 2022-06-09 ENCOUNTER — Encounter: Payer: Self-pay | Admitting: Internal Medicine

## 2022-06-09 ENCOUNTER — Other Ambulatory Visit: Payer: Self-pay | Admitting: Internal Medicine

## 2022-06-09 DIAGNOSIS — D7589 Other specified diseases of blood and blood-forming organs: Secondary | ICD-10-CM

## 2022-06-09 DIAGNOSIS — R7989 Other specified abnormal findings of blood chemistry: Secondary | ICD-10-CM

## 2022-06-11 ENCOUNTER — Telehealth: Payer: Self-pay | Admitting: Oncology

## 2022-06-11 NOTE — Telephone Encounter (Signed)
Patient currently in workque to f/u with Dr. Janese Banks in regards to her Anemia. Patient was supposed to get her labs in AUG of 2023 at [facility] and return to clinic after. Left VM requesting she call back so that we may confirm labs were done, and then get her scheduled.

## 2022-06-24 ENCOUNTER — Inpatient Hospital Stay: Payer: Medicare HMO | Attending: Oncology | Admitting: Oncology

## 2022-06-24 ENCOUNTER — Encounter: Payer: Self-pay | Admitting: Oncology

## 2022-06-24 VITALS — BP 149/65 | HR 68 | Temp 97.2°F | Resp 16 | Wt 129.4 lb

## 2022-06-24 DIAGNOSIS — R7989 Other specified abnormal findings of blood chemistry: Secondary | ICD-10-CM | POA: Diagnosis not present

## 2022-06-24 DIAGNOSIS — F17210 Nicotine dependence, cigarettes, uncomplicated: Secondary | ICD-10-CM | POA: Insufficient documentation

## 2022-06-24 DIAGNOSIS — R69 Illness, unspecified: Secondary | ICD-10-CM | POA: Diagnosis not present

## 2022-06-24 DIAGNOSIS — D7589 Other specified diseases of blood and blood-forming organs: Secondary | ICD-10-CM | POA: Diagnosis not present

## 2022-06-24 NOTE — Progress Notes (Signed)
Hematology/Oncology Consult note [facility]  [phone] [phone]  Patient Care Team: Glean Hess, MD as PCP - General (Internal Medicine) Karren Burly Deirdre Peer, MD as Consulting Physician (Ophthalmology) Dr. Kallie Locks as Consulting Physician ([facility]) Virgel Manifold, MD (Inactive) as Consulting Physician (Gastroenterology)   Name of the patient: Erika Collins  [id number]  January 02, 1946   Date of visit: 06/24/22  Diagnosis- elevated ferritin of unclear etiology  Chief complaint/ Reason for visit- routine f/u of elevated ferritin  Heme/Onc history: Patient is a 76 year old female who was seen by Dr. Bonna Gains after CT scan finding showed fatty liver located as a part of the fatty liver work-up she had a ferritin levels checked which were elevated at 440.  No prior values available for comparison.  LFTs are normal.  HFE gene assay was done a week ago and was pending at the time of my visit.  Iron saturation and iron studies was 52%.   Interval history- she is doing well. Denies any complaints at this time.  She drinks a glass of wine daily.  She also takes over-the-counter iron supplements daily  ECOG PS- 1 Pain scale- 0   Review of systems- Review of Systems  Constitutional:  Negative for chills, fever, malaise/fatigue and weight loss.  HENT:  Negative for congestion, ear discharge and nosebleeds.   Eyes:  Negative for blurred vision.  Respiratory:  Negative for cough, hemoptysis, sputum production, shortness of breath and wheezing.   Cardiovascular:  Negative for chest pain, palpitations, orthopnea and claudication.  Gastrointestinal:  Negative for abdominal pain, blood in stool, constipation, diarrhea, heartburn, melena, nausea and vomiting.  Genitourinary:  Negative for dysuria, flank pain, frequency, hematuria and urgency.  Musculoskeletal:  Negative for back pain, joint pain and myalgias.  Skin:   Negative for rash.  Neurological:  Negative for dizziness, tingling, focal weakness, seizures, weakness and headaches.  Endo/Heme/Allergies:  Does not bruise/bleed easily.  Psychiatric/Behavioral:  Negative for depression and suicidal ideas. The patient does not have insomnia.       No Known Allergies   Past Medical History:  Diagnosis Date   Allergy    Arthritis    Glaucoma    Hx of transient ischemic attack (TIA)    over 25 yrs ago.  no deficits.   Mass of appendix    Osteoarthritis    Vertebral collapse ([place])    C3,4,5,6,7 compressed over 50 years ago   Vertigo    over 10 yrs ago     Past Surgical History:  Procedure Laterality Date   COLONOSCOPY  02/24/2011   normal, repeat 10 years   COLONOSCOPY WITH PROPOFOL N/A 05/13/2021   Procedure: COLONOSCOPY WITH PROPOFOL;  Surgeon: Virgel Manifold, MD;  Location: [place];  Service: Endoscopy;  Laterality: N/A;   EYE SURGERY  Medicated Implants Both Eyes   Retina Specialist   LAPAROSCOPIC APPENDECTOMY N/A 09/11/2021   Procedure: APPENDECTOMY LAPAROSCOPIC;  Surgeon: Ileana Roup, MD;  Location: [facility];  Service: General;  Laterality: N/A;   POLYPECTOMY  05/13/2021   Procedure: POLYPECTOMY;  Surgeon: Virgel Manifold, MD;  Location: [facility] [place];  Service: Endoscopy;;   TUBAL LIGATION      Social History   Socioeconomic History   Marital status: Widowed    Spouse name: Not on file   Number of children: 1   Years of education: some college   Highest education level: 12th grade  Occupational History   Occupation:  Retired  Tobacco Use   Smoking status: Every Day    Packs/day: 0.25    Years: 45.00    Total pack years: 11.25    Types: Cigarettes    Passive exposure: Current   Smokeless tobacco: Never   Tobacco comments:    3-4 cigerettes daily  Vaping Use   Vaping Use: Never used  Substance and Sexual Activity   Alcohol use: Yes    Alcohol/week: 7.0 - 10.0 standard drinks of  alcohol    Types: 7 - 10 Glasses of wine per week   Drug use: Never   Sexual activity: Not Currently  Other Topics Concern   Not on file  Social History Narrative   Pt lives alone.    Social Determinants of Health   Financial Resource Strain: Low Risk  (05/27/2022)   Overall Financial Resource Strain (CARDIA)    Difficulty of Paying Living Expenses: Not hard at all  Food Insecurity: No Food Insecurity (05/27/2022)   Hunger Vital Sign    Worried About Running Out of Food in the Last Year: Never true    Ran Out of Food in the Last Year: Never true  Transportation Needs: No Transportation Needs (05/27/2022)   PRAPARE - Hydrologist (Medical): No    Lack of Transportation (Non-Medical): No  Physical Activity: Sufficiently Active (05/27/2022)   Exercise Vital Sign    Days of Exercise per Week: 7 days    Minutes of Exercise per Session: 60 min  Stress: No Stress Concern Present (05/27/2022)   [place]    Feeling of Stress : Not at all  Social Connections: Moderately Isolated (05/27/2022)   Social Connection and Isolation Panel [NHANES]    Frequency of Communication with Friends and Family: More than three times a week    Frequency of Social Gatherings with Friends and Family: Twice a week    Attends Religious Services: Never    Marine scientist or Organizations: Yes    Attends Archivist Meetings: Never    Marital Status: Widowed  Intimate Partner Violence: Not At Risk (05/27/2022)   Humiliation, Afraid, Rape, and Kick questionnaire    Fear of Current or Ex-Partner: No    Emotionally Abused: No    Physically Abused: No    Sexually Abused: No    Family History  Problem Relation Age of Onset   Heart attack Father    Hyperlipidemia Sister    Breast cancer Neg Hx      Current Outpatient Medications:    aspirin 81 MG tablet, Take 81 mg by mouth daily. , Disp: , Rfl:     atorvastatin (LIPITOR) 10 MG tablet, TAKE 1 TABLET BY MOUTH EVERY DAY, Disp: 90 tablet, Rfl: 0   brimonidine-timolol (COMBIGAN) 0.2-0.5 % ophthalmic solution, Place 1 drop into both eyes every 12 (twelve) hours., Disp: , Rfl:    Calcium Carbonate-Vitamin D 600-400 MG-UNIT tablet, Take 1 tablet by mouth 2 (two) times daily., Disp: , Rfl:    Omega-3 Fatty Acids (FISH OIL) 1200 MG CAPS, Take 1,200 mg by mouth in the morning and at bedtime., Disp: , Rfl:    vitamin C (ASCORBIC ACID) 500 MG tablet, Take 500 mg by mouth daily., Disp: , Rfl:    Vitamin E 400 units TABS, Take 400 Units by mouth daily., Disp: , Rfl:   Physical exam:  Vitals:   06/24/22 1021  BP: (!) 149/65  Pulse: 68  Resp: 16  Temp: (!) 97.2 F (36.2 C)  SpO2: 99%  Weight: 129 lb 6.4 oz (58.7 kg)   Physical Exam Constitutional:      General: She is not in acute distress. Cardiovascular:     Rate and Rhythm: Normal rate and regular rhythm.     Heart sounds: Normal heart sounds.  Pulmonary:     Effort: Pulmonary effort is normal.     Breath sounds: Normal breath sounds.  Skin:    General: Skin is warm and dry.  Neurological:     Mental Status: She is alert and oriented to person, place, and time.         Latest Ref Rng & Units 06/01/2022   10:40 AM  CMP  Glucose 70 - 99 mg/dL 99   BUN 8 - 27 mg/dL 12   Creatinine 0.57 - 1.00 mg/dL 0.56   Sodium 134 - 144 mmol/L 142   Potassium 3.5 - 5.2 mmol/L 4.9   Chloride 96 - 106 mmol/L 104   CO2 20 - 29 mmol/L 24   Calcium 8.7 - 10.3 mg/dL 10.5   Total Protein 6.0 - 8.5 g/dL 7.3   Total Bilirubin 0.0 - 1.2 mg/dL 0.6   Alkaline Phos 44 - 121 IU/L 71   AST 0 - 40 IU/L 22   ALT 0 - 32 IU/L 27       Latest Ref Rng & Units 06/01/2022   10:40 AM  CBC  WBC 3.4 - 10.8 x10E3/uL 5.4   Hemoglobin 11.1 - 15.9 g/dL 12.8   Hematocrit 34.0 - 46.6 % 37.9   Platelets 150 - 450 x10E3/uL 158       Assessment and plan- Patient is a 76 y.o. female here for follow-up of elevated  ferritin  Patient's ferritin levels have remained in the 400s over the last 1 year.  We do not have any prior levels for comparison.  Iron saturation is about 52%.  HFE gene testing was negative for hemochromatosis.  LFTs are normal.  CT scan done last year showed fatty liver.Follow-up 3-2 patient is a had not clinically there is no other evidence of iron overload and as such patient does not require any phlebotomy at this time.  Have asked her to cut down her oral iron altogether and cut down her wine intake 2-3 times a week as well since Daily alcohol intake is still considered unhealthy drinking and sometimes that can contribute to elevated ferritin if the liver is inflamed.  I will see her back in 6 months with CBC and ferritin.  Patient was noted to have mild macrocytosis and I will checkB12 levels within the set of labs   Visit Diagnosis 1. Elevated ferritin      Dr. Randa Evens, MD, MPH [facility] [phone] 06/24/2022 1:34 PM

## 2022-07-10 DIAGNOSIS — H4042X1 Glaucoma secondary to eye inflammation, left eye, mild stage: Secondary | ICD-10-CM | POA: Diagnosis not present

## 2022-07-10 DIAGNOSIS — H4041X3 Glaucoma secondary to eye inflammation, right eye, severe stage: Secondary | ICD-10-CM | POA: Diagnosis not present

## 2022-07-10 DIAGNOSIS — H43812 Vitreous degeneration, left eye: Secondary | ICD-10-CM | POA: Diagnosis not present

## 2022-08-05 ENCOUNTER — Other Ambulatory Visit: Payer: Self-pay | Admitting: Internal Medicine

## 2022-08-05 DIAGNOSIS — E785 Hyperlipidemia, unspecified: Secondary | ICD-10-CM

## 2022-08-05 NOTE — Telephone Encounter (Signed)
Requested Prescriptions  Pending Prescriptions Disp Refills   atorvastatin (LIPITOR) 10 MG tablet [Pharmacy Med Name: ATORVASTATIN 10 MG TABLET] 90 tablet 1    Sig: TAKE 1 TABLET BY MOUTH EVERY DAY     Cardiovascular:  Antilipid - Statins Failed - 08/05/2022  2:09 AM      Failed - Lipid Panel in normal range within the last 12 months    Cholesterol, Total  Date Value Ref Range Status  06/01/2022 155 100 - 199 mg/dL Final   LDL Chol Calc (NIH)  Date Value Ref Range Status  06/01/2022 91 0 - 99 mg/dL Final   HDL  Date Value Ref Range Status  06/01/2022 41 >39 mg/dL Final   Triglycerides  Date Value Ref Range Status  06/01/2022 126 0 - 149 mg/dL Final         Passed - Patient is not pregnant      Passed - Valid encounter within last 12 months    Recent Outpatient Visits           2 months ago Annual physical exam   [place] [facility] at [facility], Jesse Sans, MD   1 year ago Annual physical exam   [place] [facility] at [facility], Jesse Sans, MD   1 year ago Mixed hyperlipidemia   [place] [facility] at [facility], Jesse Sans, MD   2 years ago Annual physical exam   [place] [facility] at [facility], Jesse Sans, MD   2 years ago Cervical disc disorder with radiculopathy    [facility] and Sports Medicine at [facility], Jesse Sans, MD       Future Appointments             In 10 months Army Melia Jesse Sans, MD [place] [facility] at [facility], [facility]

## 2022-08-19 ENCOUNTER — Ambulatory Visit (INDEPENDENT_AMBULATORY_CARE_PROVIDER_SITE_OTHER): Payer: Medicare HMO | Admitting: Family Medicine

## 2022-08-19 ENCOUNTER — Ambulatory Visit
Admission: RE | Admit: 2022-08-19 | Discharge: 2022-08-19 | Disposition: A | Discharge status: Home / Self Care | Payer: Medicare HMO | Source: Ambulatory Visit | Attending: Family Medicine | Admitting: Family Medicine

## 2022-08-19 ENCOUNTER — Ambulatory Visit
Admission: RE | Admit: 2022-08-19 | Discharge: 2022-08-19 | Disposition: A | Discharge status: Home / Self Care | Payer: Medicare HMO | Attending: Family Medicine | Admitting: Family Medicine

## 2022-08-19 ENCOUNTER — Encounter: Payer: Self-pay | Admitting: Family Medicine

## 2022-08-19 VITALS — BP 128/80 | HR 83 | Ht 61.0 in | Wt 129.0 lb

## 2022-08-19 DIAGNOSIS — M5442 Lumbago with sciatica, left side: Secondary | ICD-10-CM | POA: Insufficient documentation

## 2022-08-19 DIAGNOSIS — M5441 Lumbago with sciatica, right side: Secondary | ICD-10-CM | POA: Insufficient documentation

## 2022-08-19 DIAGNOSIS — G8929 Other chronic pain: Secondary | ICD-10-CM | POA: Diagnosis not present

## 2022-08-19 DIAGNOSIS — M545 Low back pain, unspecified: Secondary | ICD-10-CM | POA: Diagnosis not present

## 2022-08-19 MED ORDER — PREDNISONE 20 MG PO TABS: 20.0000 mg | ORAL_TABLET | Freq: Every day | ORAL | 0 refills | Status: DC

## 2022-08-19 MED ORDER — GABAPENTIN 100 MG PO CAPS: 100.0000 mg | ORAL_CAPSULE | Freq: Every day | ORAL | 0 refills | Status: DC

## 2022-08-19 MED ORDER — DULOXETINE HCL 30 MG PO CPEP: 30.0000 mg | ORAL_CAPSULE | Freq: Every day | ORAL | 0 refills | Status: DC

## 2022-08-19 MED ORDER — MELOXICAM 7.5 MG PO TABS: 7.5000 mg | ORAL_TABLET | Freq: Every day | ORAL | 0 refills | Status: DC

## 2022-08-19 NOTE — Patient Instructions (Signed)
-   Start prednisone, take for 5-day course - After prednisone complete, start meloxicam once daily - Start duloxetine nightly - Start gabapentin nightly - Referral coordinator will contact you to schedule physical therapy, start within 1-2 weeks - Referral coordinator will also contact you to schedule visit with spine group - Return for follow-up in 6 weeks

## 2022-08-26 NOTE — Progress Notes (Signed)
     Primary Care / Sports Medicine Office Visit  Patient Information:  Patient ID: Erika Collins, female DOB: 1946-02-20 Age: 76 y.o. MRN: [id number]   Erika Collins is a pleasant 76 y.o. female presenting with the following:  Chief Complaint  Patient presents with   Back Pain    For 9 days it has been severe, has had back pain for years. Has been using heat and cold.     Vitals:   08/19/22 1114  BP: 128/80  Pulse: 83  SpO2: 99%   Vitals:   08/19/22 1114  Weight: 129 lb (58.5 kg)  Height: '5\' 1"'$  (1.549 m)   Body mass index is 24.37 kg/m.     Independent interpretation of notes and tests performed by another provider:   None  Procedures performed:   None  Pertinent History, Exam, Impression, and Recommendations:   Problem List Items Addressed This Visit       Nervous and Auditory   Chronic bilateral low back pain with left-sided sciatica - Primary    Acute on chronic condition with flare x 9 days, atraumatic. Examination with left sided radicular features and muscular tightness. Medications outlined, referral to spine group, and updated x-rays.      Relevant Medications   DULoxetine (CYMBALTA) 30 MG capsule   gabapentin (NEURONTIN) 100 MG capsule   predniSONE (DELTASONE) 20 MG tablet   meloxicam (MOBIC) 7.5 MG tablet     Orders & Medications Meds ordered this encounter  Medications   DULoxetine (CYMBALTA) 30 MG capsule    Sig: Take 1 capsule (30 mg total) by mouth daily.    Dispense:  45 capsule    Refill:  0   gabapentin (NEURONTIN) 100 MG capsule    Sig: Take 1 capsule (100 mg total) by mouth at bedtime.    Dispense:  45 capsule    Refill:  0   predniSONE (DELTASONE) 20 MG tablet    Sig: Take 1 tablet (20 mg total) by mouth daily with breakfast.    Dispense:  5 tablet    Refill:  0   meloxicam (MOBIC) 7.5 MG tablet    Sig: Take 1 tablet (7.5 mg total) by mouth daily.    Dispense:  45 tablet    Refill:  0   Orders Placed This Encounter   Procedures   DG Lumbar Spine Complete   Ambulatory referral to Pain Clinic     Return in about 6 weeks (around 09/30/2022).     Montel Culver, MD, [facility]   Primary Care Sports Medicine Primary Care and Sports Medicine at [facility]

## 2022-08-26 NOTE — Assessment & Plan Note (Signed)
Acute on chronic condition with flare x 9 days, atraumatic. Examination with left sided radicular features and muscular tightness. Medications outlined, referral to spine group, and updated x-rays.

## 2022-09-04 DIAGNOSIS — H43812 Vitreous degeneration, left eye: Secondary | ICD-10-CM | POA: Diagnosis not present

## 2022-09-04 DIAGNOSIS — H4041X3 Glaucoma secondary to eye inflammation, right eye, severe stage: Secondary | ICD-10-CM | POA: Diagnosis not present

## 2022-09-04 DIAGNOSIS — H4042X1 Glaucoma secondary to eye inflammation, left eye, mild stage: Secondary | ICD-10-CM | POA: Diagnosis not present

## 2022-09-04 DIAGNOSIS — Z961 Presence of intraocular lens: Secondary | ICD-10-CM | POA: Diagnosis not present

## 2022-09-25 ENCOUNTER — Other Ambulatory Visit: Payer: Self-pay | Admitting: Family Medicine

## 2022-09-25 DIAGNOSIS — G8929 Other chronic pain: Secondary | ICD-10-CM

## 2022-09-25 NOTE — Telephone Encounter (Signed)
Requested medication (s) are due for refill today: yes  Requested medication (s) are on the active medication list: yes  Last refill:  08/19/22  Future visit scheduled: yes  Notes to clinic:  [place]. DX Code Needed.      Requested Prescriptions  Pending Prescriptions Disp Refills   DULoxetine (CYMBALTA) 30 MG capsule [Pharmacy Med Name: DULOXETINE HCL DR 30 MG CAP] 90 capsule 1    Sig: TAKE 1 CAPSULE BY MOUTH EVERY DAY     Psychiatry: Antidepressants - SNRI - duloxetine Failed - 09/25/2022  8:32 AM      Failed - Cr in normal range and within 360 days    Creatinine, Ser  Date Value Ref Range Status  06/01/2022 0.56 (L) 0.57 - 1.00 mg/dL Final         Passed - eGFR is 30 or above and within 360 days    GFR calc Af Amer  Date Value Ref Range Status  09/24/2020 87 >59 mL/min/1.73 Final    Comment:    **In accordance with recommendations from the NKF-ASN Task force,**   [facility] is in the process of updating its eGFR calculation to the   2021 CKD-EPI creatinine equation that estimates kidney function   without a race variable.    GFR calc non Af Amer  Date Value Ref Range Status  09/24/2020 75 >59 mL/min/1.73 Final   eGFR  Date Value Ref Range Status  06/01/2022 95 >59 mL/min/1.73 Final         Passed - Completed PHQ-2 or PHQ-9 in the last 360 days      Passed - Last BP in normal range    BP Readings from Last 1 Encounters:  08/19/22 128/80         Passed - Valid encounter within last 6 months    Recent Outpatient Visits           1 month ago Chronic midline low back pain with bilateral sciatica   [place] [facility] [place], Earley Abide, MD   3 months ago Annual physical exam   [facility] at [facility], Jesse Sans, MD   1 year ago Annual physical exam   [place] [facility] at [facility], Jesse Sans, MD   2 years  ago Mixed hyperlipidemia   [place] [facility] at [facility], Jesse Sans, MD   2 years ago Annual physical exam   [facility] at [facility], Jesse Sans, MD       Future Appointments             In 4 days Zigm[facility] Daniel, Earley Abide, MD [place] [facility], [facility]   In 8 months Army Melia, Jesse Sans, MD [place] [facility] at [facility], [facility]

## 2022-09-29 ENCOUNTER — Ambulatory Visit (INDEPENDENT_AMBULATORY_CARE_PROVIDER_SITE_OTHER): Payer: Medicare HMO | Admitting: Family Medicine

## 2022-09-29 ENCOUNTER — Encounter: Payer: Self-pay | Admitting: Family Medicine

## 2022-09-29 VITALS — BP 120/80 | HR 78 | Ht 61.0 in | Wt 129.0 lb

## 2022-09-29 DIAGNOSIS — G8929 Other chronic pain: Secondary | ICD-10-CM

## 2022-09-29 DIAGNOSIS — M5442 Lumbago with sciatica, left side: Secondary | ICD-10-CM

## 2022-09-29 DIAGNOSIS — M5441 Lumbago with sciatica, right side: Secondary | ICD-10-CM | POA: Diagnosis not present

## 2022-09-29 MED ORDER — GABAPENTIN 100 MG PO CAPS: 200.0000 mg | ORAL_CAPSULE | Freq: Every day | ORAL | 1 refills | Status: DC

## 2022-09-29 MED ORDER — DULOXETINE HCL 30 MG PO CPEP: 30.0000 mg | ORAL_CAPSULE | Freq: Every day | ORAL | 1 refills | Status: DC

## 2022-09-29 NOTE — Patient Instructions (Signed)
-   Continue duloxetine daily -Increase gabapentin to 2 capsules (200 mg) nightly - Can continue meloxicam on a daily as-needed basis (take with food), as symptoms improve try to limit dosing of meloxicam as symptoms allow - Continue exercises as you have currently been doing - Contact our office for any questions, persistent left outer knee pain, and follow-up as needed

## 2022-09-29 NOTE — Assessment & Plan Note (Signed)
Chronic condition, still symptomatic, improved.  Patient does report improvement with duloxetine, gabapentin, does dose meloxicam daily.  Has noted improvement in morning symptoms though persistent focality to the left lateral knee.  Examination with symmetric strength and painless range of motion in bilateral lower extremities, negative straight leg raise bilaterally, sensorimotor overall intact.  She does have focal tenderness at the left knee lateral joint line but otherwise left knee exam benign.  I discussed the patient that her symptoms may be stemming from underlying neuropathic concerns versus the arthralgia.  At this stage we will titrate gabapentin to 200 mg nightly, continue other medications and have encouraged limiting meloxicam dosing.  She can follow-up as needed particular for persistent left lateral knee pain at which time we can obtain x-rays and go from there.

## 2022-09-29 NOTE — Progress Notes (Signed)
     Primary Care / Sports Medicine Office Visit  Patient Information:  Patient ID: Erika Collins, female DOB: 10/29/1945 Age: 77 y.o. MRN: [id number]   Erika Collins is a pleasant 77 y.o. female presenting with the following:  Chief Complaint  Patient presents with   Chronic midline low back pain with bilateral sciatica    Is feeling better    Vitals:   09/29/22 1001  BP: 120/80  Pulse: 78  SpO2: 98%   Vitals:   09/29/22 1001  Weight: 129 lb (58.5 kg)  Height: '5\' 1"'$  (1.549 m)   Body mass index is 24.37 kg/m.  No results found.   Independent interpretation of notes and tests performed by another provider:   None  Procedures performed:   None  Pertinent History, Exam, Impression, and Recommendations:   Amariana was seen today for chronic midline low back pain with bilateral sciatica.  Chronic midline low back pain with bilateral sciatica Assessment & Plan: Chronic condition, still symptomatic, improved.  Patient does report improvement with duloxetine, gabapentin, does dose meloxicam daily.  Has noted improvement in morning symptoms though persistent focality to the left lateral knee.  Examination with symmetric strength and painless range of motion in bilateral lower extremities, negative straight leg raise bilaterally, sensorimotor overall intact.  She does have focal tenderness at the left knee lateral joint line but otherwise left knee exam benign.  I discussed the patient that her symptoms may be stemming from underlying neuropathic concerns versus the arthralgia.  At this stage we will titrate gabapentin to 200 mg nightly, continue other medications and have encouraged limiting meloxicam dosing.  She can follow-up as needed particular for persistent left lateral knee pain at which time we can obtain x-rays and go from there.  Orders: -     DULoxetine HCl; Take 1 capsule (30 mg total) by mouth daily.  Dispense: 90 capsule; Refill: 1 -     Gabapentin; Take 2 capsules  (200 mg total) by mouth at bedtime.  Dispense: 180 capsule; Refill: 1     Orders & Medications Meds ordered this encounter  Medications   DULoxetine (CYMBALTA) 30 MG capsule    Sig: Take 1 capsule (30 mg total) by mouth daily.    Dispense:  90 capsule    Refill:  1   gabapentin (NEURONTIN) 100 MG capsule    Sig: Take 2 capsules (200 mg total) by mouth at bedtime.    Dispense:  180 capsule    Refill:  1   No orders of the defined types were placed in this encounter.    Return if symptoms worsen or fail to improve.     Montel Culver, MD, [facility]   Primary Care Sports Medicine Primary Care and Sports Medicine at [facility]

## 2022-11-10 DIAGNOSIS — H44113 Panuveitis, bilateral: Secondary | ICD-10-CM | POA: Diagnosis not present

## 2022-11-10 DIAGNOSIS — H35373 Puckering of macula, bilateral: Secondary | ICD-10-CM | POA: Diagnosis not present

## 2022-12-08 ENCOUNTER — Encounter: Payer: Self-pay | Admitting: Student in an Organized Health Care Education/Training Program

## 2022-12-08 ENCOUNTER — Ambulatory Visit
Payer: Medicare HMO | Attending: Student in an Organized Health Care Education/Training Program | Admitting: Student in an Organized Health Care Education/Training Program

## 2022-12-08 VITALS — BP 96/57 | HR 69 | Temp 97.3°F | Resp 18 | Ht 61.0 in | Wt 130.0 lb

## 2022-12-08 DIAGNOSIS — M4126 Other idiopathic scoliosis, lumbar region: Secondary | ICD-10-CM | POA: Diagnosis not present

## 2022-12-08 DIAGNOSIS — G8929 Other chronic pain: Secondary | ICD-10-CM | POA: Insufficient documentation

## 2022-12-08 DIAGNOSIS — G894 Chronic pain syndrome: Secondary | ICD-10-CM | POA: Insufficient documentation

## 2022-12-08 DIAGNOSIS — M5441 Lumbago with sciatica, right side: Secondary | ICD-10-CM | POA: Diagnosis not present

## 2022-12-08 DIAGNOSIS — M5442 Lumbago with sciatica, left side: Secondary | ICD-10-CM

## 2022-12-08 DIAGNOSIS — M47816 Spondylosis without myelopathy or radiculopathy, lumbar region: Secondary | ICD-10-CM | POA: Insufficient documentation

## 2022-12-08 NOTE — Progress Notes (Signed)
Patient: Erika Collins  Service Category: E/M  Provider: Gillis Santa, MD  DOB: [birth year]  DOS: 12/08/2022  Referring Provider: Montel Culver, MD  MRN: [id number]  Setting: Ambulatory outpatient  PCP: Glean Hess, MD  Type: New Patient  Specialty: Interventional Pain Management    Location: Office  Delivery: Face-to-face     Primary Reason(s) for Visit: Encounter for initial evaluation of one or more chronic problems (new to examiner) potentially causing chronic pain, and posing a threat to normal musculoskeletal function. (Level of risk: High) CC: Back Pain (low)  HPI  Erika Collins is a 77 y.o. year old, female patient, who comes for the first time to our practice referred by Montel Culver, MD for our initial evaluation of her chronic pain. She has Primary open angle glaucoma; Choroiditis of both eyes; Mixed hyperlipidemia; Tobacco use; Cervical disc disorder with radiculopathy; History of TIA (transient ischemic attack); Vitamin D deficiency; OA (osteoarthritis) of finger, unspecified laterality; Numbness and tingling of foot; Chronic midline low back pain with bilateral sciatica; Polyp of transverse colon; Lumbar facet arthropathy; Chronic pain syndrome; and Other idiopathic scoliosis, lumbar region on their problem list. Today she comes in for evaluation of her Back Pain (low)  Pain Assessment: Location: Right, Left Back Radiating: radiates into top front and back opf legs Onset: More than a month ago Duration: Chronic pain Quality: Aching, Tightness Severity: 2 /10 (subjective, self-reported pain score)  Effect on ADL: Limits activities Timing: Constant Modifying factors: stretching BP: (!) 96/57  HR: 69  Onset and Duration: Gradual and Present longer than 3 months Cause of pain:  fell down stairs 50 plus yrs ago Severity: Getting worse, NAS-11 at its worse: 10/10, NAS-11 at its best: 2/10, NAS-11 now: 2/10, and NAS-11 on the average: 2/10 Timing: Afternoon, During activity  or exercise, and After a period of immobility Aggravating Factors: Bending, Kneeling, Prolonged sitting, Walking, and Walking uphill Alleviating Factors: Stretching, Medications, and Walking with walker Associated Problems: Fatigue and Numbness Quality of Pain: Aching, Annoying, Exhausting, Sharp, Shooting, and Uncomfortable Previous Examinations or Tests: CT scan, MRI scan, X-rays, and Orthopedic evaluation Previous Treatments: Chiropractic manipulations, Strengthening exercises, and Stretching exercises  Erika Collins is being evaluated for possible interventional pain management therapies for the treatment of her chronic pain.   Erika Collins is a pleasant 77 year old female who presents with a chief complaint of low back pain with radiation primarily down her left leg.  She states that she is doing much better than when her referral was placed from Dr. Zigmund Daniel.  She states that she has a history of lumbar scoliosis and sustained a fall about 50 years ago that really aggravated her low back.  She is endorsing pain also along her left lateral knee joint.  She has been on gabapentin 200 mg nightly which she does find benefit from along with Cymbalta 30 mg daily.  She states that she has done physical therapy in the past for this.  She states that when she was having her pain flare, her pain was very severe and that she was having trouble ambulating but since then her pain has improved.  We discussed obtaining a lumbar MRI to further evaluate any foraminal stenosis, canal stenosis that could be contributing to her pain.  Patient in agreement with plan.  Meds   Current Outpatient Medications:    aspirin 81 MG tablet, Take 81 mg by mouth daily. , Disp: , Rfl:    atorvastatin (LIPITOR) 10 MG tablet, TAKE 1  TABLET BY MOUTH EVERY DAY, Disp: 90 tablet, Rfl: 1   brimonidine-timolol (COMBIGAN) 0.2-0.5 % ophthalmic solution, Place 1 drop into both eyes every 12 (twelve) hours., Disp: , Rfl:    DULoxetine  (CYMBALTA) 30 MG capsule, Take 1 capsule (30 mg total) by mouth daily., Disp: 90 capsule, Rfl: 1   gabapentin (NEURONTIN) 100 MG capsule, Take 2 capsules (200 mg total) by mouth at bedtime., Disp: 180 capsule, Rfl: 1   vitamin C (ASCORBIC ACID) 500 MG tablet, Take 500 mg by mouth daily., Disp: , Rfl:   Imaging Review  Cervical Imaging: Cervical MR wo contrast: Results for orders placed during the hospital encounter of 01/27/20  MR Cervical Spine Wo Contrast  Narrative CLINICAL DATA:  Cervical radiculopathy.  Left hand pain and numbness  EXAM: MRI CERVICAL SPINE WITHOUT CONTRAST  TECHNIQUE: Multiplanar, multisequence MR imaging of the cervical spine was performed. No intravenous contrast was administered.  COMPARISON:  None.  FINDINGS: Alignment: Straightening of the cervical spine with mild C3-4 anterolisthesis.  Vertebrae: No fracture, evidence of discitis, or bone lesion. Fatty endplate marrow conversion at levels of greatest disc degeneration.  Cord: Normal signal and morphology.  Posterior Fossa, vertebral arteries, paraspinal tissues: Negative.  Disc levels:  C2-3: Facet osteoarthritis with spurring and ankylosis on the right. No impingement  C3-4: Disc narrowing with endplate and uncovertebral ridging. Mild facet spurring. Disc osteophyte complex effaces the ventral CSF without cord compression. The foramina are patent  C4-5: Disc narrowing with endplate and uncovertebral ridging. Mild to moderate left and high-grade right foraminal stenosis. Mild spinal stenosis.  C5-6: Disc narrowing with endplate and uncovertebral ridging. Mild spinal stenosis. Advanced left and mild to moderate right foraminal narrowing  C6-7: Disc narrowing with bulging and uncovertebral ridging. Mild to moderate left foraminal narrowing.  C7-T1:Small biforaminal protrusion and mild degenerative facet spurring. Patent canal and foramina based on axial slices.  IMPRESSION: 1.  Generalized cervical spine degeneration with mild C3-4 anterolisthesis. 2. On the symptomatic left side there is foraminal stenosis from C4-5 to C6-7, advanced at C5-6. 3. Foraminal stenosis on the right at C4-5 and C5-6 primarily.   Electronically Signed By: Monte Fantasia M.D. On: 01/28/2020 17:53   DG Lumbar Spine Complete  Narrative CLINICAL DATA:  Chronic low back pain with radicular features.  EXAM: LUMBAR SPINE - COMPLETE 4+ VIEW  COMPARISON:  None Available.  FINDINGS: No fracture or bone lesion.  Grade 1 anterolisthesis of L5 on S1.  No other spondylolisthesis.  Moderate loss of disc height from L3-L4 through L5-S1. L1-L2 and L2-L3 disc spaces are well preserved. There are facet degenerative changes bilaterally at L4-L5 and L5-S1.  Mild curvature, convex the right, apex at L3-L4.  Skeletal structures are demineralized.  Atherosclerotic calcifications noted along the course of the abdominal aorta.  IMPRESSION: 1. No fracture or acute finding. 2. Lower lumbar spine disc and facet degenerative changes as detailed.   Electronically Signed By: Lajean Manes M.D. On: 08/20/2022 15:44 Complexity Note: Imaging results reviewed.                         ROS  Cardiovascular: Daily Aspirin intake Pulmonary or Respiratory: No reported pulmonary signs or symptoms such as wheezing and difficulty taking a deep full breath (Asthma), difficulty blowing air out (Emphysema), coughing up mucus (Bronchitis), persistent dry cough, or temporary stoppage of breathing during sleep Neurological: No reported neurological signs or symptoms such as seizures, abnormal skin sensations, urinary and/or fecal  incontinence, being born with an abnormal open spine and/or a tethered spinal cord Psychological-Psychiatric: No reported psychological or psychiatric signs or symptoms such as difficulty sleeping, anxiety, depression, delusions or hallucinations (schizophrenial), mood swings  (bipolar disorders) or suicidal ideations or attempts Gastrointestinal: No reported gastrointestinal signs or symptoms such as vomiting or evacuating blood, reflux, heartburn, alternating episodes of diarrhea and constipation, inflamed or scarred liver, or pancreas or irrregular and/or infrequent bowel movements Genitourinary: No reported renal or genitourinary signs or symptoms such as difficulty voiding or producing urine, peeing blood, non-functioning kidney, kidney stones, difficulty emptying the bladder, difficulty controlling the flow of urine, or chronic kidney disease Hematological: Brusing easily and Bleeding easily Endocrine: No reported endocrine signs or symptoms such as high or low blood sugar, rapid heart rate due to high thyroid levels, obesity or weight gain due to slow thyroid or thyroid disease Rheumatologic: Joint aches and or swelling due to excess weight (Osteoarthritis) Musculoskeletal: Negative for myasthenia gravis, muscular dystrophy, multiple sclerosis or malignant hyperthermia Work History: Retired  Allergies  Erika Collins has No Known Allergies.  Laboratory Chemistry Profile   Renal Lab Results  Component Value Date   BUN 12 06/01/2022   CREATININE 0.56 (L) 06/01/2022   BCR 21 06/01/2022   GFRAA 87 09/24/2020   GFRNONAA 75 09/24/2020   SPECGRAV 1.015 05/28/2021   PHUR 6.5 05/28/2021   PROTEINUR Negative 05/28/2021     Electrolytes Lab Results  Component Value Date   NA 142 06/01/2022   K 4.9 06/01/2022   CL 104 06/01/2022   CALCIUM 10.5 (H) 06/01/2022     Hepatic Lab Results  Component Value Date   AST 22 06/01/2022   ALT 27 06/01/2022   ALBUMIN 4.1 06/01/2022   ALKPHOS 71 06/01/2022     ID Lab Results  Component Value Date   HCVAB NON REACTIVE 05/21/2020     Bone Lab Results  Component Value Date   VD25OH 32.3 05/28/2021     Endocrine Lab Results  Component Value Date   GLUCOSE 99 06/01/2022   HGBA1C 5.6 06/01/2022   TSH 1.130  06/01/2022     Neuropathy Lab Results  Component Value Date   VITAMINB12 355 05/19/2019   HGBA1C 5.6 06/01/2022     CNS No results found for: "COLORCSF", "APPEARCSF", "RBCCOUNTCSF", "WBCCSF", "POLYSCSF", "LYMPHSCSF", "EOSCSF", "PROTEINCSF", "GLUCCSF", "JCVIRUS", "CSFOLI", "IGGCSF", "LABACHR", "ACETBL"   Inflammation (CRP: Acute  ESR: Chronic) No results found for: "CRP", "ESRSEDRATE", "LATICACIDVEN"   Rheumatology No results found for: "RF", "ANA", "LABURIC", "URICUR", "LYMEIGGIGMAB", "LYMEABIGMQN", "HLAB27"   Coagulation Lab Results  Component Value Date   PLT 158 06/01/2022     Cardiovascular Lab Results  Component Value Date   HGB 12.8 06/01/2022   HCT 37.9 06/01/2022     Screening Lab Results  Component Value Date   HCVAB NON REACTIVE 05/21/2020     Cancer No results found for: "CEA", "CA125", "LABCA2"   Allergens No results found for: "ALMOND", "APPLE", "ASPARAGUS", "AVOCADO", "BANANA", "BARLEY", "BASIL", "BAYLEAF", "GREENBEAN", "LIMABEAN", "WHITEBEAN", "BEEFIGE", "REDBEET", "BLUEBERRY", "BROCCOLI", "CABBAGE", "MELON", "CARROT", "CASEIN", "CASHEWNUT", "CAULIFLOWER", "CELERY"     Note: Lab results reviewed.  [place]  Drug: Erika Collins  reports no history of drug use. Alcohol:  reports current alcohol use of about 7.0 - 10.0 standard drinks of alcohol per week. Tobacco:  reports that she has been smoking cigarettes. She has a 11.25 pack-year smoking history. She has been exposed to tobacco smoke. She has never used smokeless tobacco. Medical:  has a past  medical history of Allergy, Arthritis, Glaucoma, transient ischemic attack (TIA), Mass of appendix, Osteoarthritis, Vertebral collapse ([place]), and Vertigo. Family: family history includes Heart attack in her father; Hyperlipidemia in her sister.  Past Surgical History:  Procedure Laterality Date   COLONOSCOPY  02/24/2011   normal, repeat 10 years   COLONOSCOPY WITH PROPOFOL N/A 05/13/2021   Procedure: COLONOSCOPY  WITH PROPOFOL;  Surgeon: Virgel Manifold, MD;  Location: [place];  Service: Endoscopy;  Laterality: N/A;   EYE SURGERY  Medicated Implants Both Eyes   Retina Specialist   LAPAROSCOPIC APPENDECTOMY N/A 09/11/2021   Procedure: APPENDECTOMY LAPAROSCOPIC;  Surgeon: Ileana Roup, MD;  Location: [facility];  Service: General;  Laterality: N/A;   POLYPECTOMY  05/13/2021   Procedure: POLYPECTOMY;  Surgeon: Virgel Manifold, MD;  Location: [place];  Service: Endoscopy;;   TUBAL LIGATION     Active Ambulatory Problems    Diagnosis Date Noted   Primary open angle glaucoma 01/13/2013   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   Mixed hyperlipidemia 12/13/2017   Tobacco use 12/13/2017   Cervical disc disorder with radiculopathy 05/17/2018   History of TIA (transient ischemic attack) 05/17/2018   Vitamin D deficiency 05/17/2018   OA (osteoarthritis) of finger, unspecified laterality 05/19/2019   Numbness and tingling of foot 05/19/2019   Chronic midline low back pain with bilateral sciatica 10/11/2019   Polyp of transverse colon    Lumbar facet arthropathy 12/08/2022   Chronic pain syndrome 12/08/2022   Other idiopathic scoliosis, lumbar region 12/08/2022   Resolved Ambulatory Problems    Diagnosis Date Noted   Colon cancer screening    Past Medical History:  Diagnosis Date   Allergy    Arthritis    Glaucoma    Hx of transient ischemic attack (TIA)    Mass of appendix    Osteoarthritis    Vertebral collapse ([place])    Vertigo    Constitutional Exam  General appearance: Well nourished, well developed, and well hydrated. In no apparent acute distress Vitals:   12/08/22 1107  BP: (!) 96/57  Pulse: 69  Resp: 18  Temp: (!) 97.3 F (36.3 C)  SpO2: 100%  Weight: 130 lb (59 kg)  Height: 5\' 1"  (1.549 m)   BMI Assessment: Estimated body mass index is 24.56 kg/m as calculated from the following:   Height as of this encounter: 5\' 1"  (1.549 m).   Weight as  of this encounter: 130 lb (59 kg).  BMI interpretation table: BMI level Category Range association with higher incidence of chronic pain  <18 kg/m2 Underweight   18.5-24.9 kg/m2 Ideal body weight   25-29.9 kg/m2 Overweight Increased incidence by 20%  30-34.9 kg/m2 Obese (Class I) Increased incidence by 68%  35-39.9 kg/m2 Severe obesity (Class II) Increased incidence by 136%  >40 kg/m2 Extreme obesity (Class III) Increased incidence by 254%   Patient's current BMI Ideal Body weight  Body mass index is 24.56 kg/m. Ideal body weight: 47.8 kg (105 lb 6.1 oz) Adjusted ideal body weight: 52.3 kg (115 lb 3.7 oz)   BMI Readings from Last 4 Encounters:  12/08/22 24.56 kg/m  09/29/22 24.37 kg/m  08/19/22 24.37 kg/m  06/24/22 24.45 kg/m   Wt Readings from Last 4 Encounters:  12/08/22 130 lb (59 kg)  09/29/22 129 lb (58.5 kg)  08/19/22 129 lb (58.5 kg)  06/24/22 129 lb 6.4 oz (58.7 kg)    Psych/Mental status: Alert, oriented x 3 (person, place, & time)  Eyes: PERLA Respiratory: No evidence of acute respiratory distress  Lumbar Spine Area Exam  Skin & Axial Inspection: Lumbar Scoliosis Alignment: Symmetrical Functional ROM: Pain restricted ROM affecting primarily the left Stability: No instability detected Muscle Tone/Strength: Functionally intact. No obvious neuro-muscular anomalies detected. Sensory (Neurological): Musculoskeletal pain pattern  Gait & Posture Assessment  Ambulation: Unassisted Gait: Relatively normal for age and body habitus Posture: WNL  Lower Extremity Exam    Side: Right lower extremity  Side: Left lower extremity  Stability: No instability observed          Stability: No instability observed          Skin & Extremity Inspection: Skin color, temperature, and hair growth are WNL. No peripheral edema or cyanosis. No masses, redness, swelling, asymmetry, or associated skin lesions. No contractures.  Skin & Extremity Inspection: Skin color, temperature,  and hair growth are WNL. No peripheral edema or cyanosis. No masses, redness, swelling, asymmetry, or associated skin lesions. No contractures.  Functional ROM: Unrestricted ROM                  Functional ROM: Unrestricted ROM                  Muscle Tone/Strength: Functionally intact. No obvious neuro-muscular anomalies detected.  Muscle Tone/Strength: Functionally intact. No obvious neuro-muscular anomalies detected.  Sensory (Neurological): Unimpaired        Sensory (Neurological): Musculoskeletal pain pattern        DTR: Patellar: deferred today Achilles: deferred today Plantar: deferred today  DTR: Patellar: deferred today Achilles: deferred today Plantar: deferred today  Palpation: No palpable anomalies  Palpation: No palpable anomalies   5 out of 5 strength bilateral lower extremity: Plantar flexion, dorsiflexion, knee flexion, knee extension.   Assessment  Primary Diagnosis & Pertinent Problem List: The primary encounter diagnosis was Chronic midline low back pain with bilateral sciatica. Diagnoses of Other idiopathic scoliosis, lumbar region, Lumbar facet arthropathy, and Chronic pain syndrome were also pertinent to this visit.  Visit Diagnosis (New problems to examiner): 1. Chronic midline low back pain with bilateral sciatica   2. Other idiopathic scoliosis, lumbar region   3. Lumbar facet arthropathy   4. Chronic pain syndrome    Plan of Care (Initial workup plan)  I am pleased to hear that patient is doing better than she was when referral was placed from Dr. Zigmund Daniel.  Her lumbar spine x-ray does reveal lumbar facet degenerative changes at L4-L5 and L5-S1.  She states that at the time of her referral, she was having pain radiation down her left leg.  She states that is now better.  She is also endorsing left lateral knee pain.  She states that she does have chronic pain and has learned to live with it.  We discussed increasing her gabapentin to 300 mg nightly and also  continuing Cymbalta as prescribed.  I also encouraged the patient to try Voltaren gel to her left lateral knee and consider acetaminophen 500 mg 3 times daily as needed.  I will also place an order for a MR of her lumbar spine to evaluate for any foraminal, canal stenosis that could contribute to her lumbar radicular pain.  Imaging Orders         MR LUMBAR SPINE WO CONTRAST      Provider-requested follow-up: Return in about 6 weeks (around 01/19/2023) for After Imaging.  Future Appointments  Date Time Provider [place]  12/29/2022 11:00 AM [facility] None  12/29/2022  11:15 AM Sindy Guadeloupe, MD [facility] None  06/04/2023  8:40 AM Glean Hess, MD [facility]  06/07/2023 11:00 AM [facility]-ANNUAL WELLNESS VISIT [facility]    Duration of encounter: 59minutes.  Total time on encounter, as per AMA guidelines included both the face-to-face and non-face-to-face time personally spent by the physician and/or other qualified health care professional(s) on the day of the encounter (includes time in activities that require the physician or other qualified health care professional and does not include time in activities normally performed by clinical staff). Physician's time may include the following activities when performed: Preparing to see the patient (e.g., pre-charting review of records, searching for previously ordered imaging, lab work, and nerve conduction tests) Review of prior analgesic pharmacotherapies. Reviewing PMP Interpreting ordered tests (e.g., lab work, imaging, nerve conduction tests) Performing post-procedure evaluations, including interpretation of diagnostic procedures Obtaining and/or reviewing separately obtained history Performing a medically appropriate examination and/or evaluation Counseling and educating the patient/family/caregiver Ordering medications, tests, or procedures Referring and communicating with other health care professionals (when not separately  reported) Documenting clinical information in the electronic or other health record Independently interpreting results (not separately reported) and communicating results to the patient/ family/caregiver Care coordination (not separately reported)  Note by: Gillis Santa, MD (TTS technology used. I apologize for any typographical errors that were not detected and corrected.) Date: 12/08/2022; Time: 1:35 PM

## 2022-12-08 NOTE — Progress Notes (Signed)
Safety precautions to be maintained throughout the outpatient stay will include: orient to surroundings, keep bed in low position, maintain call bell within reach at all times, provide assistance with transfer out of bed and ambulation.  

## 2022-12-08 NOTE — Patient Instructions (Signed)
Begin taking Gabapentin 100 mg 3 capsules at night.

## 2022-12-26 ENCOUNTER — Ambulatory Visit: Payer: Medicare HMO

## 2022-12-28 ENCOUNTER — Ambulatory Visit: Payer: Medicare HMO | Admitting: Oncology

## 2022-12-28 ENCOUNTER — Other Ambulatory Visit: Payer: Medicare HMO

## 2022-12-29 ENCOUNTER — Encounter: Payer: Self-pay | Admitting: Oncology

## 2022-12-29 ENCOUNTER — Inpatient Hospital Stay (HOSPITAL_BASED_OUTPATIENT_CLINIC_OR_DEPARTMENT_OTHER): Payer: Medicare HMO | Admitting: Oncology

## 2022-12-29 ENCOUNTER — Ambulatory Visit
Admission: RE | Admit: 2022-12-29 | Discharge: 2022-12-29 | Disposition: A | Discharge status: Home / Self Care | Payer: Medicare HMO | Source: Ambulatory Visit | Attending: Student in an Organized Health Care Education/Training Program | Admitting: Student in an Organized Health Care Education/Training Program

## 2022-12-29 ENCOUNTER — Inpatient Hospital Stay: Payer: Medicare HMO | Attending: Oncology

## 2022-12-29 VITALS — BP 166/74 | HR 72 | Temp 97.6°F | Resp 17 | Wt 134.0 lb

## 2022-12-29 DIAGNOSIS — M5441 Lumbago with sciatica, right side: Secondary | ICD-10-CM | POA: Diagnosis not present

## 2022-12-29 DIAGNOSIS — M1990 Unspecified osteoarthritis, unspecified site: Secondary | ICD-10-CM | POA: Insufficient documentation

## 2022-12-29 DIAGNOSIS — K760 Fatty (change of) liver, not elsewhere classified: Secondary | ICD-10-CM | POA: Insufficient documentation

## 2022-12-29 DIAGNOSIS — M549 Dorsalgia, unspecified: Secondary | ICD-10-CM | POA: Diagnosis not present

## 2022-12-29 DIAGNOSIS — G8929 Other chronic pain: Secondary | ICD-10-CM

## 2022-12-29 DIAGNOSIS — M5442 Lumbago with sciatica, left side: Secondary | ICD-10-CM | POA: Diagnosis not present

## 2022-12-29 DIAGNOSIS — G894 Chronic pain syndrome: Secondary | ICD-10-CM

## 2022-12-29 DIAGNOSIS — F17210 Nicotine dependence, cigarettes, uncomplicated: Secondary | ICD-10-CM | POA: Insufficient documentation

## 2022-12-29 DIAGNOSIS — M47816 Spondylosis without myelopathy or radiculopathy, lumbar region: Secondary | ICD-10-CM | POA: Diagnosis not present

## 2022-12-29 DIAGNOSIS — R7989 Other specified abnormal findings of blood chemistry: Secondary | ICD-10-CM

## 2022-12-29 DIAGNOSIS — M4126 Other idiopathic scoliosis, lumbar region: Secondary | ICD-10-CM | POA: Diagnosis not present

## 2022-12-29 DIAGNOSIS — M545 Low back pain, unspecified: Secondary | ICD-10-CM | POA: Diagnosis not present

## 2022-12-29 LAB — CBC WITH DIFFERENTIAL/PLATELET
Abs Immature Granulocytes: 0.03 10*3/uL (ref 0.00–0.07)
Basophils Absolute: 0 10*3/uL (ref 0.0–0.1)
Basophils Relative: 1 %
Eosinophils Absolute: 0.3 10*3/uL (ref 0.0–0.5)
Eosinophils Relative: 5 %
HCT: 37.5 % (ref 36.0–46.0)
Hemoglobin: 12.3 g/dL (ref 12.0–15.0)
Immature Granulocytes: 1 %
Lymphocytes Relative: 20 %
Lymphs Abs: 1.2 10*3/uL (ref 0.7–4.0)
MCH: 33.2 pg (ref 26.0–34.0)
MCHC: 32.8 g/dL (ref 30.0–36.0)
MCV: 101.1 fL — ABNORMAL HIGH (ref 80.0–100.0)
Monocytes Absolute: 0.5 10*3/uL (ref 0.1–1.0)
Monocytes Relative: 9 %
Neutro Abs: 3.8 10*3/uL (ref 1.7–7.7)
Neutrophils Relative %: 64 %
Platelets: 151 10*3/uL (ref 150–400)
RBC: 3.71 MIL/uL — ABNORMAL LOW (ref 3.87–5.11)
RDW: 12.5 % (ref 11.5–15.5)
WBC: 5.9 10*3/uL (ref 4.0–10.5)
nRBC: 0 % (ref 0.0–0.2)

## 2022-12-29 LAB — COMPREHENSIVE METABOLIC PANEL
ALT: 28 U/L (ref 0–44)
AST: 32 U/L (ref 15–41)
Albumin: 4 g/dL (ref 3.5–5.0)
Alkaline Phosphatase: 56 U/L (ref 38–126)
Anion gap: 8 (ref 5–15)
BUN: 13 mg/dL (ref 8–23)
CO2: 23 mmol/L (ref 22–32)
Calcium: 9.6 mg/dL (ref 8.9–10.3)
Chloride: 107 mmol/L (ref 98–111)
Creatinine, Ser: 0.54 mg/dL (ref 0.44–1.00)
GFR, Estimated: >60 mL/min (ref >60)
Glucose, Bld: 100 mg/dL — ABNORMAL HIGH (ref 70–99)
Potassium: 3.9 mmol/L (ref 3.5–5.1)
Sodium: 138 mmol/L (ref 135–145)
Total Bilirubin: 0.8 mg/dL (ref 0.3–1.2)
Total Protein: 7.6 g/dL (ref 6.5–8.1)

## 2022-12-29 LAB — VITAMIN B12: Vitamin B-12: 435 pg/mL (ref 180–914)

## 2022-12-29 LAB — FERRITIN: Ferritin: 241 ng/mL (ref 11–307)

## 2022-12-29 NOTE — Progress Notes (Signed)
Patient here for oncology follow-up appointment,  concerns of arthritic pain

## 2022-12-29 NOTE — Progress Notes (Signed)
Hematology/Oncology Consult note [facility]  [phone] [phone]  Patient Care Team: Reubin Milan, MD as PCP - General (Internal Medicine) Sherlyn Lees Lavella Hammock, MD as Consulting Physician (Ophthalmology) Dr. Alvester Chou as Consulting Physician (Retina Ophthalmology) Pasty Spillers, MD (Inactive) as Consulting Physician (Gastroenterology)   Name of the patient: Erika Collins  [id number]  1945/12/28   Date of visit: 12/29/22  Diagnosis- elevated ferritin of unclear etiology   Chief complaint/ Reason for visit-routine follow-up of elevated ferritin  Heme/Onc history:  Patient is a 77 year old female who was seen by Dr. Maximino Greenland after CT scan finding showed fatty liver located as a part of the fatty liver work-up she had a ferritin levels checked which were elevated at 440.  No prior values available for comparison.  LFTs are normal.  CT abdomen has shown findings of fatty liver.  Iron saturation and iron studies was 52%.  Hemochromatosis testing negative.  Interval history-patient drinks about half a glass of wine daily.  She is otherwise doing well for her age.  She does have chronic back pain from osteoarthritis and is following up with orthopedics  ECOG PS- 1 Pain scale- 4   Review of systems- Review of Systems  Constitutional:  Negative for chills, fever, malaise/fatigue and weight loss.  HENT:  Negative for congestion, ear discharge and nosebleeds.   Eyes:  Negative for blurred vision.  Respiratory:  Negative for cough, hemoptysis, sputum production, shortness of breath and wheezing.   Cardiovascular:  Negative for chest pain, palpitations, orthopnea and claudication.  Gastrointestinal:  Negative for abdominal pain, blood in stool, constipation, diarrhea, heartburn, melena, nausea and vomiting.  Genitourinary:  Negative for dysuria, flank pain, frequency, hematuria and urgency.  Musculoskeletal:  Positive for back  pain. Negative for joint pain and myalgias.  Skin:  Negative for rash.  Neurological:  Negative for dizziness, tingling, focal weakness, seizures, weakness and headaches.  Endo/Heme/Allergies:  Does not bruise/bleed easily.  Psychiatric/Behavioral:  Negative for depression and suicidal ideas. The patient does not have insomnia.       No Known Allergies   Past Medical History:  Diagnosis Date   Allergy    Arthritis    Glaucoma    Hx of transient ischemic attack (TIA)    over 25 yrs ago.  no deficits.   Mass of appendix    Osteoarthritis    Vertebral collapse    C3,4,5,6,7 compressed over 50 years ago   Vertigo    over 10 yrs ago     Past Surgical History:  Procedure Laterality Date   COLONOSCOPY  02/24/2011   normal, repeat 10 years   COLONOSCOPY WITH PROPOFOL N/A 05/13/2021   Procedure: COLONOSCOPY WITH PROPOFOL;  Surgeon: Pasty Spillers, MD;  Location: [facility];  Service: Endoscopy;  Laterality: N/A;   EYE SURGERY  Medicated Implants Both Eyes   Retina Specialist   LAPAROSCOPIC APPENDECTOMY N/A 09/11/2021   Procedure: APPENDECTOMY LAPAROSCOPIC;  Surgeon: Andria Meuse, MD;  Location: [facility];  Service: General;  Laterality: N/A;   POLYPECTOMY  05/13/2021   Procedure: POLYPECTOMY;  Surgeon: Pasty Spillers, MD;  Location: [facility];  Service: Endoscopy;;   TUBAL LIGATION      Social History   Socioeconomic History   Marital status: Widowed    Spouse name: Not on file   Number of children: 1   Years of education: some college   Highest education level: 12th grade  Occupational History  Occupation: Retired  Tobacco Use   Smoking status: Every Day    Packs/day: 0.25    Years: 45.00    Additional pack years: 0.00    Total pack years: 11.25    Types: Cigarettes    Passive exposure: Current   Smokeless tobacco: Never   Tobacco comments:    3-4 cigerettes daily  Vaping Use   Vaping Use: Never used  Substance and Sexual  Activity   Alcohol use: Yes    Alcohol/week: 7.0 - 10.0 standard drinks of alcohol    Types: 7 - 10 Glasses of wine per week   Drug use: Never   Sexual activity: Not Currently  Other Topics Concern   Not on file  Social History Narrative   Pt lives alone.    Social Determinants of Health   Financial Resource Strain: Low Risk  (05/27/2022)   Overall Financial Resource Strain (CARDIA)    Difficulty of Paying Living Expenses: Not hard at all  Food Insecurity: No Food Insecurity (05/27/2022)   Hunger Vital Sign    Worried About Running Out of Food in the Last Year: Never true    Ran Out of Food in the Last Year: Never true  Transportation Needs: No Transportation Needs (05/27/2022)   PRAPARE - Administrator, Civil ServiceTransportation    Lack of Transportation (Medical): No    Lack of Transportation (Non-Medical): No  Physical Activity: Sufficiently Active (05/27/2022)   Exercise Vital Sign    Days of Exercise per Week: 7 days    Minutes of Exercise per Session: 60 min  Stress: No Stress Concern Present (05/27/2022)   [employer] - Occupational Stress Questionnaire    Feeling of Stress : Not at all  Social Connections: Moderately Isolated (05/27/2022)   Social Connection and Isolation Panel [NHANES]    Frequency of Communication with Friends and Family: More than three times a week    Frequency of Social Gatherings with Friends and Family: Twice a week    Attends Religious Services: Never    Database administratorActive Member of Clubs or Organizations: Yes    Attends BankerClub or Organization Meetings: Never    Marital Status: Widowed  Intimate Partner Violence: Not At Risk (05/27/2022)   Humiliation, Afraid, Rape, and Kick questionnaire    Fear of Current or Ex-Partner: No    Emotionally Abused: No    Physically Abused: No    Sexually Abused: No    Family History  Problem Relation Age of Onset   Heart attack Father    Hyperlipidemia Sister    Breast cancer Neg Hx      Current Outpatient Medications:     aspirin 81 MG tablet, Take 81 mg by mouth daily. , Disp: , Rfl:    atorvastatin (LIPITOR) 10 MG tablet, TAKE 1 TABLET BY MOUTH EVERY DAY, Disp: 90 tablet, Rfl: 1   brimonidine-timolol (COMBIGAN) 0.2-0.5 % ophthalmic solution, Place 1 drop into both eyes every 12 (twelve) hours., Disp: , Rfl:    DULoxetine (CYMBALTA) 30 MG capsule, Take 1 capsule (30 mg total) by mouth daily., Disp: 90 capsule, Rfl: 1   gabapentin (NEURONTIN) 100 MG capsule, Take 2 capsules (200 mg total) by mouth at bedtime., Disp: 180 capsule, Rfl: 1   RHOPRESSA 0.02 % SOLN, , Disp: , Rfl:    vitamin C (ASCORBIC ACID) 500 MG tablet, Take 500 mg by mouth daily., Disp: , Rfl:   Physical exam:  Vitals:   12/29/22 1105 12/29/22 1109  BP: (!) 163/70 (!) 166/74  Pulse: 72   Resp: 17   Temp: 97.6 F (36.4 C)   TempSrc: Tympanic   SpO2: 98%   Weight: 134 lb (60.8 kg)    Physical Exam Cardiovascular:     Rate and Rhythm: Normal rate and regular rhythm.     Heart sounds: Normal heart sounds.  Pulmonary:     Effort: Pulmonary effort is normal.     Breath sounds: Normal breath sounds.  Abdominal:     General: Bowel sounds are normal.     Palpations: Abdomen is soft.  Skin:    General: Skin is warm and dry.  Neurological:     Mental Status: She is alert and oriented to person, place, and time.         Latest Ref Rng & Units 12/29/2022   10:44 AM  CMP  Glucose 70 - 99 mg/dL 025   BUN 8 - 23 mg/dL 13   Creatinine 8.52 - 1.00 mg/dL 7.78   Sodium 242 - 353 mmol/L 138   Potassium 3.5 - 5.1 mmol/L 3.9   Chloride 98 - 111 mmol/L 107   CO2 22 - 32 mmol/L 23   Calcium 8.9 - 10.3 mg/dL 9.6   Total Protein 6.5 - 8.1 g/dL 7.6   Total Bilirubin 0.3 - 1.2 mg/dL 0.8   Alkaline Phos 38 - 126 U/L 56   AST 15 - 41 U/L 32   ALT 0 - 44 U/L 28       Latest Ref Rng & Units 12/29/2022   10:44 AM  CBC  WBC 4.0 - 10.5 K/uL 5.9   Hemoglobin 12.0 - 15.0 g/dL 61.4   Hematocrit 43.1 - 46.0 % 37.5   Platelets 150 - 400 K/uL 151       Assessment and plan- Patient is a 77 y.o. female here for routine follow-up of elevated ferritin  Ferritin levels were in the 400s over the last 1 year. Today they are not within normal limits at 241.  She does not have any evidence of hemochromatosis and does not require any phlebotomy.  Clinically there is no evidence of iron overload.  I will repeat CBC and ferritin in 6 months in 1 year and see her back in 1 year   Visit Diagnosis 1. Elevated ferritin      Dr. Owens Shark, MD, MPH [facility] [phone] 12/29/2022 1:13 PM

## 2023-01-29 ENCOUNTER — Other Ambulatory Visit: Payer: Self-pay | Admitting: Internal Medicine

## 2023-01-29 DIAGNOSIS — E785 Hyperlipidemia, unspecified: Secondary | ICD-10-CM

## 2023-01-29 NOTE — Telephone Encounter (Signed)
Requested Prescriptions  Pending Prescriptions Disp Refills   atorvastatin (LIPITOR) 10 MG tablet [Pharmacy Med Name: ATORVASTATIN 10 MG TABLET] 90 tablet 0    Sig: TAKE 1 TABLET BY MOUTH EVERY DAY     Cardiovascular:  Antilipid - Statins Failed - 01/29/2023  2:34 AM      Failed - Lipid Panel in normal range within the last 12 months    Cholesterol, Total  Date Value Ref Range Status  06/01/2022 155 100 - 199 mg/dL Final   LDL Chol Calc (NIH)  Date Value Ref Range Status  06/01/2022 91 0 - 99 mg/dL Final   HDL  Date Value Ref Range Status  06/01/2022 41 >39 mg/dL Final   Triglycerides  Date Value Ref Range Status  06/01/2022 126 0 - 149 mg/dL Final         Passed - Patient is not pregnant      Passed - Valid encounter within last 12 months    Recent Outpatient Visits           4 months ago Chronic midline low back pain with bilateral sciatica   [facility] Royalty, Ocie Bob, MD   5 months ago Chronic midline low back pain with bilateral sciatica   [facility], Ocie Bob, MD   8 months ago Annual physical exam   [facility] at [facility], Nyoka Cowden, MD   1 year ago Annual physical exam   [facility] at [facility], Nyoka Cowden, MD   2 years ago Mixed hyperlipidemia   [facility] at [facility], Nyoka Cowden, MD       Future Appointments             In 4 months Judithann Graves, Nyoka Cowden, MD [facility] at [facility], [facility]

## 2023-02-02 ENCOUNTER — Ambulatory Visit
Payer: Medicare HMO | Attending: Student in an Organized Health Care Education/Training Program | Admitting: Student in an Organized Health Care Education/Training Program

## 2023-02-02 ENCOUNTER — Encounter: Payer: Self-pay | Admitting: Student in an Organized Health Care Education/Training Program

## 2023-02-02 VITALS — BP 142/60 | HR 66 | Temp 97.2°F | Resp 16 | Ht 61.0 in | Wt 130.0 lb

## 2023-02-02 DIAGNOSIS — G894 Chronic pain syndrome: Secondary | ICD-10-CM

## 2023-02-02 DIAGNOSIS — M48062 Spinal stenosis, lumbar region with neurogenic claudication: Secondary | ICD-10-CM | POA: Insufficient documentation

## 2023-02-02 DIAGNOSIS — M5136 Other intervertebral disc degeneration, lumbar region: Secondary | ICD-10-CM | POA: Diagnosis not present

## 2023-02-02 DIAGNOSIS — M5416 Radiculopathy, lumbar region: Secondary | ICD-10-CM

## 2023-02-02 DIAGNOSIS — M47816 Spondylosis without myelopathy or radiculopathy, lumbar region: Secondary | ICD-10-CM | POA: Diagnosis not present

## 2023-02-02 DIAGNOSIS — G8929 Other chronic pain: Secondary | ICD-10-CM | POA: Insufficient documentation

## 2023-02-02 NOTE — Progress Notes (Signed)
PROVIDER NOTE: Information contained herein reflects review and annotations entered in association with encounter. Interpretation of such information and data should be left to medically-trained personnel. Information provided to patient can be located elsewhere in the medical record under "Patient Instructions". Document created using STT-dictation technology, any transcriptional errors that may result from process are unintentional.    Patient: Erika Collins  Service Category: E/M  Provider: Edward Jolly, MD  DOB: April 02, 1946  DOS: 02/02/2023  Referring Provider: Reubin Milan, MD  MRN: [id number]  Specialty: Interventional Pain Management  PCP: Reubin Milan, MD  Type: Established Patient  Setting: Ambulatory outpatient    Location: Office  Delivery: Face-to-face     HPI  [name], a 77 y.o. year old female, is here today because of her Lumbar spondylosis [M47.816]. [name] primary complain today is Back Pain (lower)  Pertinent problems: [name] has Lumbar spondylosis; Chronic radicular lumbar pain; Spinal stenosis, lumbar region, with neurogenic claudication; and Lumbar degenerative disc disease on their pertinent problem list. Pain Assessment: Severity of Chronic pain is reported as a 4 /10. Location: Back Right, Left, Lower/hips bilateral  to top of thighs front and back. Onset: More than a month ago. Quality: Aching, Tightness, Constant. Timing: Constant. Modifying factor(s): steching. Vitals:  height is 5\' 1"  (1.549 m) and weight is 130 lb (59 kg). Her temperature is 97.2 F (36.2 C) (abnormal). Her blood pressure is 142/60 (abnormal) and her pulse is 66. Her respiration is 16 and oxygen saturation is 100%.  BMI: Estimated body mass index is 24.56 kg/m as calculated from the following:   Height as of this encounter: 5\' 1"  (1.549 m).   Weight as of this encounter: 130 lb (59 kg). Last encounter: 12/08/2022. Last procedure: Visit date not found.  Reason for encounter:  follow-up evaluation to review L-MRI  Patient presents today to review her lumbar MRI.  She states that she is doing much better overall although she continues to have intermittent low back pain with occasional radiation into her bilateral hips and anterior thighs.  She states that she has been ambulating with a walker for when she has to walk an extended distance.  She states that her hips do feel tight with prolonged walking.  She has increased pain with upright posture.  She states that the higher dose of gabapentin resulted in exhaustion and fatigue.  She continues to 100 mg daily and is currently on Cymbalta.  She is wondering if the gabapentin is actually helpful.  HPI from initial clinic visit: [name] is a pleasant 77 year old female who presents with a chief complaint of low back pain with radiation primarily down her left leg. She states that she is doing much better than when her referral was placed from Dr. Ashley Royalty. She states that she has a history of lumbar scoliosis and sustained a fall about 50 years ago that really aggravated her low back. She is endorsing pain also along her left lateral knee joint. She has been on gabapentin 200 mg nightly which she does find benefit from along with Cymbalta 30 mg daily. She states that she has done physical therapy in the past for this. She states that when she was having her pain flare, her pain was very severe and that she was having trouble ambulating but since then her pain has improved. We discussed obtaining a lumbar MRI to further evaluate any foraminal stenosis, canal stenosis that could be contributing to her pain. Patient in agreement with plan.  ROS  Constitutional: Denies any fever or chills Gastrointestinal: No reported hemesis, hematochezia, vomiting, or acute GI distress Musculoskeletal:  Low back pain, radiation into bilateral buttocks and anterior thighs Neurological: No reported episodes of acute onset apraxia, aphasia,  dysarthria, agnosia, amnesia, paralysis, loss of coordination, or loss of consciousness  Medication Review  DULoxetine, Netarsudil Dimesylate, ascorbic acid, aspirin, atorvastatin, brimonidine-timolol, and gabapentin  History Review  Allergy: [name] has No Known Allergies. Drug: [name]  reports no history of drug use. Alcohol:  reports current alcohol use of about 7.0 - 10.0 standard drinks of alcohol per week. Tobacco:  reports that she has been smoking cigarettes. She has a 11.25 pack-year smoking history. She has been exposed to tobacco smoke. She has never used smokeless tobacco. Social: [name]  reports that she has been smoking cigarettes. She has a 11.25 pack-year smoking history. She has been exposed to tobacco smoke. She has never used smokeless tobacco. She reports current alcohol use of about 7.0 - 10.0 standard drinks of alcohol per week. She reports that she does not use drugs. Medical:  has a past medical history of Allergy, Arthritis, Glaucoma, transient ischemic attack (TIA), Mass of appendix, Osteoarthritis, Vertebral collapse (HCC), and Vertigo. Surgical: [name]  has a past surgical history that includes Tubal ligation; Colonoscopy (02/24/2011); Colonoscopy with propofol (N/A, 05/13/2021); polypectomy (05/13/2021); Eye surgery (Medicated Implants Both Eyes); and laparoscopic appendectomy (N/A, 09/11/2021). Family: family history includes Heart attack in her father; Hyperlipidemia in her sister.  Laboratory Chemistry Profile   Renal Lab Results  Component Value Date   BUN 13 12/29/2022   CREATININE 0.54 12/29/2022   BCR 21 06/01/2022   GFRAA 87 09/24/2020   GFRNONAA >60 12/29/2022    Hepatic Lab Results  Component Value Date   AST 32 12/29/2022   ALT 28 12/29/2022   ALBUMIN 4.0 12/29/2022   ALKPHOS 56 12/29/2022   HCVAB NON REACTIVE 05/21/2020    Electrolytes Lab Results  Component Value Date   NA 138 12/29/2022   K 3.9 12/29/2022   CL 107 12/29/2022    CALCIUM 9.6 12/29/2022    Bone Lab Results  Component Value Date   VD25OH 32.3 05/28/2021    Inflammation (CRP: Acute Phase) (ESR: Chronic Phase) No results found for: "CRP", "ESRSEDRATE", "LATICACIDVEN"       Note: Above Lab results reviewed.  Recent Imaging Review  MR LUMBAR SPINE WO CONTRAST CLINICAL DATA:  Low back pain with left-sided radiculopathy  EXAM: MRI LUMBAR SPINE WITHOUT CONTRAST  TECHNIQUE: Multiplanar, multisequence MR imaging of the lumbar spine was performed. No intravenous contrast was administered.  COMPARISON:  X-ray 08/19/2022  FINDINGS: Segmentation:  Standard.  Alignment:  Grade 1 anterolisthesis of L5 on S1.  Vertebrae: Mild superior endplate compression deformity at L1 with approximately 30% height loss and no bony retropulsion. Mild marrow edema at the L1 superior endplate may be discogenic or related to subacute fracture. Remaining vertebral body heights are maintained without additional fracture. Discogenic endplate marrow changes are most pronounced at L4-L5. Reactive marrow edema associated with the bilateral L4-5 facet joints, right worse than left. No evidence of discitis. No suspicious marrow replacing bone lesion.  Conus medullaris and cauda equina: Conus extends to the L1 level. Conus and cauda equina appear normal.  Paraspinal and other soft tissues: Negative.  Disc levels:  T12-L1: Broad-based disc bulge, eccentric to the left. No foraminal or canal stenosis.  L1-L2: Minimal disc bulge and endplate spurring. No significant foraminal stenosis. No canal  stenosis.  L2-L3: Mild disc bulge and endplate spurring. Minimal facet hypertrophy. Borderline-mild canal stenosis. No significant foraminal stenosis.  L3-L4: Endplate osteophytic ridging with mild bilateral facet arthropathy. No significant canal stenosis. Mild bilateral foraminal stenosis.  L4-L5: Disc bulge and endplate ridging with moderate-advanced bilateral facet  arthropathy and mild ligamentum flavum buckling. Moderate canal stenosis with moderate right and mild left foraminal stenosis.  L5-S1: Endplate osteophytic ridging and moderate bilateral facet arthropathy. No significant foraminal or canal stenosis.  IMPRESSION: 1. Mild superior endplate compression deformity at L1. Mild marrow edema at the L1 superior endplate may be discogenic or related to subacute fracture. 2. Multilevel lumbar spondylosis, most pronounced at L4-L5 where there is moderate canal stenosis with moderate right and mild left foraminal stenosis. 3. Reactive marrow edema associated with the bilateral L4-5 facet joints, right worse than left. This may be a focal source of pain.  Electronically Signed   By: Duanne Guess D.O.   On: 12/31/2022 10:18 Note: Reviewed        Physical Exam  General appearance: Well nourished, well developed, and well hydrated. In no apparent acute distress Mental status: Alert, oriented x 3 (person, place, & time)       Respiratory: No evidence of acute respiratory distress Eyes: PERLA Vitals: BP (!) 142/60   Pulse 66   Temp (!) 97.2 F (36.2 C)   Resp 16   Ht 5\' 1"  (1.549 m)   Wt 130 lb (59 kg)   SpO2 100%   BMI 24.56 kg/m  BMI: Estimated body mass index is 24.56 kg/m as calculated from the following:   Height as of this encounter: 5\' 1"  (1.549 m).   Weight as of this encounter: 130 lb (59 kg). Ideal: Ideal body weight: 47.8 kg (105 lb 6.1 oz) Adjusted ideal body weight: 52.3 kg (115 lb 3.7 oz)  Lumbar Spine Area Exam  Skin & Axial Inspection: Lumbar Scoliosis Alignment: Symmetrical Functional ROM: Pain restricted ROM affecting primarily the left Stability: No instability detected Muscle Tone/Strength: Functionally intact. No obvious neuro-muscular anomalies detected. Sensory (Neurological): Musculoskeletal pain pattern   Gait & Posture Assessment  Ambulation: Unassisted Gait: Relatively normal for age and body  habitus Posture: WNL  Lower Extremity Exam      Side: Right lower extremity   Side: Left lower extremity  Stability: No instability observed           Stability: No instability observed          Skin & Extremity Inspection: Skin color, temperature, and hair growth are WNL. No peripheral edema or cyanosis. No masses, redness, swelling, asymmetry, or associated skin lesions. No contractures.   Skin & Extremity Inspection: Skin color, temperature, and hair growth are WNL. No peripheral edema or cyanosis. No masses, redness, swelling, asymmetry, or associated skin lesions. No contractures.  Functional ROM: Unrestricted ROM                   Functional ROM: Unrestricted ROM                  Muscle Tone/Strength: Functionally intact. No obvious neuro-muscular anomalies detected.   Muscle Tone/Strength: Functionally intact. No obvious neuro-muscular anomalies detected.  Sensory (Neurological): Unimpaired         Sensory (Neurological): Musculoskeletal pain pattern        DTR: Patellar: deferred today Achilles: deferred today Plantar: deferred today   DTR: Patellar: deferred today Achilles: deferred today Plantar: deferred today  Palpation: No palpable anomalies  Palpation: No palpable anomalies    5 out of 5 strength bilateral lower extremity: Plantar flexion, dorsiflexion, knee flexion, knee extension.    Assessment   Diagnosis Status  1. Lumbar spondylosis   2. Lumbar facet arthropathy   3. Chronic radicular lumbar pain   4. Spinal stenosis, lumbar region, with neurogenic claudication   5. Lumbar degenerative disc disease   6. Chronic pain syndrome    Controlled Controlled Controlled   Updated Problems: Problem  Chronic Radicular Lumbar Pain  Spinal Stenosis, Lumbar Region, With Neurogenic Claudication  Lumbar Degenerative Disc Disease  Lumbar Spondylosis    Plan of Care  We reviewed her lumbar MRI.  She does have moderate to severe lumbar spondylosis which is most pronounced  at L4-L5 resulting in moderate canal stenosis with moderate right and mild left foraminal stenosis.  There is also reactive marrow edema at the bilateral L4-L5 facet joints.  We discussed stretching exercises and I encouraged her to continue with walking and stretching as she has been.  I encouraged her to decrease her gabapentin to 100 mg to see if her pain worsens and if it does not she can try discontinuing.  She would like to discontinue gabapentin altogether and see if it negatively impacts her pain I informed her that if her pain does increase in the future that she is welcome to contact me for discussion of a lumbar epidural steroid injection versus diagnostic lumbar facet medial branch nerve blocks.  There is nothing to do urgently at this point as her pain is under control and not to the level that it was at while she was experiencing a pain flare prompting a referral to see me.  Patient in agreement with plan.   Follow-up plan:   Return for patient will call to schedule F2F appt prn.      Recent Visits Date Type Provider Dept  12/08/22 Office Visit Edward Jolly, MD [facility]  Showing recent visits within past 90 days and meeting all other requirements Today's Visits Date Type Provider Dept  02/02/23 Office Visit Edward Jolly, MD [facility]  Showing today's visits and meeting all other requirements Future Appointments No visits were found meeting these conditions. Showing future appointments within next 90 days and meeting all other requirements  I discussed the assessment and treatment plan with the patient. The patient was provided an opportunity to ask questions and all were answered. The patient agreed with the plan and demonstrated an understanding of the instructions.  Patient advised to call back or seek an in-person evaluation if the symptoms or condition worsens.  Duration of encounter: .  Total time on encounter, as per AMA guidelines  included both the face-to-face and non-face-to-face time personally spent by the physician and/or other qualified health care professional(s) on the day of the encounter (includes time in activities that require the physician or other qualified health care professional and does not include time in activities normally performed by clinical staff). Physician's time may include the following activities when performed: Preparing to see the patient (e.g., pre-charting review of records, searching for previously ordered imaging, lab work, and nerve conduction tests) Review of prior analgesic pharmacotherapies. Reviewing PMP Interpreting ordered tests (e.g., lab work, imaging, nerve conduction tests) Performing post-procedure evaluations, including interpretation of diagnostic procedures Obtaining and/or reviewing separately obtained history Performing a medically appropriate examination and/or evaluation Counseling and educating the patient/family/caregiver Ordering medications, tests, or procedures Referring and communicating with other health care professionals (when not separately  reported) Documenting clinical information in the electronic or other health record Independently interpreting results (not separately reported) and communicating results to the patient/ family/caregiver Care coordination (not separately reported)  Note by: Edward Jolly, MD Date: 02/02/2023; Time: 2:25 PM

## 2023-02-02 NOTE — Progress Notes (Signed)
Safety precautions to be maintained throughout the outpatient stay will include: orient to surroundings, keep bed in low position, maintain call bell within reach at all times, provide assistance with transfer out of bed and ambulation.  

## 2023-02-24 ENCOUNTER — Ambulatory Visit
Admission: EM | Admit: 2023-02-24 | Discharge: 2023-02-24 | Disposition: A | Discharge status: Home / Self Care | Payer: Medicare HMO | Attending: Family Medicine | Admitting: Family Medicine

## 2023-02-24 DIAGNOSIS — L241 Irritant contact dermatitis due to oils and greases: Secondary | ICD-10-CM

## 2023-02-24 DIAGNOSIS — R030 Elevated blood-pressure reading, without diagnosis of hypertension: Secondary | ICD-10-CM

## 2023-02-24 MED ORDER — PREDNISONE 10 MG (21) PO TBPK: ORAL_TABLET | Freq: Every day | ORAL | 0 refills | Status: DC

## 2023-02-24 MED ORDER — DEXAMETHASONE SODIUM PHOSPHATE 10 MG/ML IJ SOLN
10.0000 mg | Freq: Once | INTRAMUSCULAR | Status: AC
  Administered 2023-02-24: 10 mg via INTRAMUSCULAR

## 2023-02-24 MED ORDER — TRIAMCINOLONE ACETONIDE 0.1 % EX OINT: 1.0000 | TOPICAL_OINTMENT | Freq: Two times a day (BID) | CUTANEOUS | 0 refills | Status: DC

## 2023-02-24 NOTE — ED Triage Notes (Signed)
Pt presents for concern of poison ivy, pt states she was out doing yard work, Sunday developed rash on forehead and scalp, facial swelling onset day before yesterday.

## 2023-02-24 NOTE — Discharge Instructions (Addendum)
Have fun at the zoo!  Stop by the pharmacy to pick up your prescriptions.  Follow up with your primary care and eye provider if blood pressure or eye discomfort increases.  Monitor your blood pressure while taking steroids.

## 2023-02-24 NOTE — ED Provider Notes (Signed)
[facility]    CSN: [id number] Arrival date & time: 02/24/23  0906      History   Chief Complaint Chief Complaint  Patient presents with   Poison Ivy   Facial Swelling    HPI Erika Collins is a 77 y.o. female.   HPI  Erika Collins presents for rash with facial swelling that occurred after doing yard work on Sunday.  She was wearing long sleeves and pants but forgot her hat. She walked under a tree that had a poisonous plant on it. A couple days later she noticed tingling on her scalp and in her hair.  The next day, she started having swelling of her left eye and forehead irritation. Denies fever. Has chronic hand pain and back pain.  She is going to the zoo with the family this weekend and wants this cleared up before then.   Past Medical History:  Diagnosis Date   Allergy    Arthritis    Glaucoma    Hx of transient ischemic attack (TIA)    over 25 yrs ago.  no deficits.   Mass of appendix    Osteoarthritis    Vertebral collapse (HCC)    C3,4,5,6,7 compressed over 50 years ago   Vertigo    over 10 yrs ago    Patient Active Problem List   Diagnosis Date Noted   Chronic radicular lumbar pain 02/02/2023   Spinal stenosis, lumbar region, with neurogenic claudication 02/02/2023   Lumbar degenerative disc disease 02/02/2023   Lumbar spondylosis 12/08/2022   Chronic pain syndrome 12/08/2022   Other idiopathic scoliosis, lumbar region 12/08/2022   Polyp of transverse colon    Chronic midline low back pain with bilateral sciatica 10/11/2019   OA (osteoarthritis) of finger, unspecified laterality 05/19/2019   Numbness and tingling of foot 05/19/2019   Cervical disc disorder with radiculopathy 05/17/2018   History of TIA (transient ischemic attack) 05/17/2018   Vitamin D deficiency 05/17/2018   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   Mixed hyperlipidemia 12/13/2017   Tobacco use 12/13/2017   Primary open angle glaucoma 01/13/2013    Past Surgical History:   Procedure Laterality Date   COLONOSCOPY  02/24/2011   normal, repeat 10 years   COLONOSCOPY WITH PROPOFOL N/A 05/13/2021   Procedure: COLONOSCOPY WITH PROPOFOL;  Surgeon: Pasty Spillers, MD;  Location: [facility];  Service: Endoscopy;  Laterality: N/A;   EYE SURGERY  Medicated Implants Both Eyes   Retina Specialist   LAPAROSCOPIC APPENDECTOMY N/A 09/11/2021   Procedure: APPENDECTOMY LAPAROSCOPIC;  Surgeon: Andria Meuse, MD;  Location: [facility];  Service: General;  Laterality: N/A;   POLYPECTOMY  05/13/2021   Procedure: POLYPECTOMY;  Surgeon: Pasty Spillers, MD;  Location: [facility];  Service: Endoscopy;;   TUBAL LIGATION      OB History   No obstetric history on file.      Home Medications    Prior to Admission medications   Medication Sig Start Date End Date Taking? Authorizing Provider  aspirin 81 MG tablet Take 81 mg by mouth daily.    Yes [provider]  atorvastatin (LIPITOR) 10 MG tablet TAKE 1 TABLET BY MOUTH EVERY DAY 01/29/23  Yes Reubin Milan, MD  brimonidine-timolol (COMBIGAN) 0.2-0.5 % ophthalmic solution Place 1 drop into both eyes every 12 (twelve) hours.   Yes [provider]  DULoxetine (CYMBALTA) 30 MG capsule Take 1 capsule (30 mg total) by mouth daily. 09/29/22  Yes Jerrol Banana, MD  gabapentin (NEURONTIN) 100 MG capsule Take 2 capsules (200 mg total) by mouth at bedtime. 09/29/22  Yes Jerrol Banana, MD  predniSONE (STERAPRED UNI-PAK 21 TAB) 10 MG (21) TBPK tablet Take by mouth daily. Take 6 tabs by mouth daily for 1, then 5 tabs for 1 day, then 4 tabs for 1 day, then 3 tabs for 1 day, then 2 tabs for 1 day, then 1 tab for 1 day. 02/24/23  Yes Hillary Schwegler, DO  RHOPRESSA 0.02 % SOLN    Yes [provider]  triamcinolone ointment (KENALOG) 0.1 % Apply 1 Application topically 2 (two) times daily. 02/24/23  Yes Deedee Lybarger, DO  vitamin C (ASCORBIC ACID) 500 MG tablet Take 500 mg by mouth  daily.   Yes [provider]    Family History Family History  Problem Relation Age of Onset   Heart attack Father    Hyperlipidemia Sister    Breast cancer Neg Hx     Social History Social History   Tobacco Use   Smoking status: Every Day    Packs/day: 0.25    Years: 45.00    Additional pack years: 0.00    Total pack years: 11.25    Types: Cigarettes    Passive exposure: Current   Smokeless tobacco: Never   Tobacco comments:    3-4 cigerettes daily  Vaping Use   Vaping Use: Never used  Substance Use Topics   Alcohol use: Yes    Alcohol/week: 7.0 - 10.0 standard drinks of alcohol    Types: 7 - 10 Glasses of wine per week   Drug use: Never     Allergies   Patient has no known allergies.   Review of Systems Review of Systems :negative unless otherwise stated in HPI.      Physical Exam Triage Vital Signs ED Triage Vitals [02/24/23 0938]  Enc Vitals Group     BP (!) 155/77     Pulse Rate 71     Resp      Temp 99.1 F (37.3 C)     Temp Source Oral     SpO2 95 %     Weight      Height      Head Circumference      Peak Flow      Pain Score 0     Pain Loc      Pain Edu?      Excl. in GC?    No data found.  Updated Vital Signs BP (!) 162/83 (BP Location: Left Arm)   Pulse 71   Temp 99.1 F (37.3 C) (Oral)   SpO2 95%   Visual Acuity Right Eye Distance:   Left Eye Distance:   Bilateral Distance:    Right Eye Near:   Left Eye Near:    Bilateral Near:     Physical Exam  GEN: alert, well appearing female, in no acute distress  EYES: extra occular movements intact, no scleral injection CV: regular rate and rhythm RESP: no increased work of breathing, clear to ascultation bilaterally MSK: no extremity edema, no gross deformities NEURO: alert, moves all extremities appropriately, normal gait PSYCH: Normal affect, appropriate speech and behavior  SKIN: warm and dry; serous fluid collection under left eye and somewhat under right eye,  crusty erythematous blistering on left forearm and top of head    UC Treatments / Results  Labs (all labs ordered are listed, but only abnormal results are displayed) Labs Reviewed - No data to  display  EKG   Radiology No results found.  Procedures Procedures (including critical care time)  Medications Ordered in UC Medications  dexamethasone (DECADRON) injection 10 mg (10 mg Intramuscular Given 02/24/23 1009)    Initial Impression / Assessment and Plan / UC Course  I have reviewed the triage vital signs and the nursing notes.  Pertinent labs & imaging results that were available during my care of the patient were reviewed by me and considered in my medical decision making (see chart for details).     Patient is a 78 y.o. femalewho presents for rash and facial swelling for the past 3 days.  Overall, patient is well-appearing and well-hydrated.  Vital signs stable.  Lacreasha is afebrile.  Exam concerning for contact dermatitis. Discussed Decadron injection pt agreeable. Given Decadron 10 mg IM.  Treat with steroid taper and ointment.  No sign of infection to suggest antibiotics at this time.  Given history of eye issues she is to follow up with her eye care provider.   Alyzabeth is hypertensive here.  BP 155/77 then after sitting was 162/83. Pt went to doctor a few weeks ago her BP was negative.  and notes her blood pressure had been normal at home. I suspect high blood pressure issue is secondary to pain. Has no history of hypertension. Recommended she check her blood pressure when the pain and rash subsides and follow up with her primary care provider in the next 2 weeks.   Reviewed expectations regarding course of current medical issues.  All questions asked were answered.  Outlined signs and symptoms indicating need for more acute intervention. Patient verbalized understanding. After Visit Summary given.   Final Clinical Impressions(s) / UC Diagnoses   Final diagnoses:  Irritant  contact dermatitis due to oils  Elevated blood-pressure reading, without diagnosis of hypertension     Discharge Instructions      Have fun at the zoo!  Stop by the pharmacy to pick up your prescriptions.  Follow up with your primary care and eye provider if blood pressure or eye discomfort increases.  Monitor your blood pressure while taking steroids.        ED Prescriptions     Medication Sig Dispense Auth. Provider   predniSONE (STERAPRED UNI-PAK 21 TAB) 10 MG (21) TBPK tablet Take by mouth daily. Take 6 tabs by mouth daily for 1, then 5 tabs for 1 day, then 4 tabs for 1 day, then 3 tabs for 1 day, then 2 tabs for 1 day, then 1 tab for 1 day. 21 tablet Keeara Frees, DO   triamcinolone ointment (KENALOG) 0.1 % Apply 1 Application topically 2 (two) times daily. 30 g Katha Cabal, DO      PDMP not reviewed this encounter.              Katha Cabal, DO 02/24/23 1018

## 2023-03-12 DIAGNOSIS — H35371 Puckering of macula, right eye: Secondary | ICD-10-CM | POA: Diagnosis not present

## 2023-03-12 DIAGNOSIS — H43812 Vitreous degeneration, left eye: Secondary | ICD-10-CM | POA: Diagnosis not present

## 2023-03-12 DIAGNOSIS — H4042X1 Glaucoma secondary to eye inflammation, left eye, mild stage: Secondary | ICD-10-CM | POA: Diagnosis not present

## 2023-03-12 DIAGNOSIS — H4041X3 Glaucoma secondary to eye inflammation, right eye, severe stage: Secondary | ICD-10-CM | POA: Diagnosis not present

## 2023-03-24 ENCOUNTER — Other Ambulatory Visit: Payer: Self-pay | Admitting: Family Medicine

## 2023-03-24 DIAGNOSIS — G8929 Other chronic pain: Secondary | ICD-10-CM

## 2023-03-29 ENCOUNTER — Other Ambulatory Visit: Payer: Self-pay | Admitting: Internal Medicine

## 2023-03-29 DIAGNOSIS — Z1231 Encounter for screening mammogram for malignant neoplasm of breast: Secondary | ICD-10-CM

## 2023-03-30 ENCOUNTER — Other Ambulatory Visit: Payer: Self-pay | Admitting: Family Medicine

## 2023-03-30 DIAGNOSIS — G8929 Other chronic pain: Secondary | ICD-10-CM

## 2023-03-30 NOTE — Telephone Encounter (Signed)
Requested Prescriptions  Pending Prescriptions Disp Refills   DULoxetine (CYMBALTA) 30 MG capsule [Pharmacy Med Name: DULOXETINE HCL DR 30 MG CAP] 90 capsule 1    Sig: TAKE 1 CAPSULE BY MOUTH EVERY DAY     Psychiatry: Antidepressants - SNRI - duloxetine Failed - 03/30/2023 12:54 PM      Failed - Last BP in normal range    BP Readings from Last 1 Encounters:  02/24/23 (!) 162/83         Failed - Valid encounter within last 6 months    Recent Outpatient Visits           6 months ago Chronic midline low back pain with bilateral sciatica   [place] [facility] at [facility], Ocie Bob, MD   7 months ago Chronic midline low back pain with bilateral sciatica   [place] [facility], Ocie Bob, MD   10 months ago Annual physical exam   [facility] at [facility], Nyoka Cowden, MD   1 year ago Annual physical exam   [place] [facility] at [facility], Nyoka Cowden, MD   2 years ago Mixed hyperlipidemia   [place] [facility] at [facility], Nyoka Cowden, MD       Future Appointments             In 2 months Reubin Milan, MD [facility] at [facility], [facility]            Passed - Cr in normal range and within 360 days    Creatinine, Ser  Date Value Ref Range Status  12/29/2022 0.54 0.44 - 1.00 mg/dL Final         Passed - eGFR is 30 or above and within 360 days    GFR calc Af Amer  Date Value Ref Range Status  09/24/2020 87 >59 mL/min/1.73 Final    Comment:    **In accordance with recommendations from the NKF-ASN Task force,**   [facility] is in the process of updating its eGFR calculation to the   2021 CKD-EPI creatinine equation that estimates kidney function   without a race variable.    GFR, Estimated  Date Value Ref Range Status  12/29/2022 >60 >60  mL/min Final    Comment:    (NOTE) Calculated using the CKD-EPI Creatinine Equation (2021)    eGFR  Date Value Ref Range Status  06/01/2022 95 >59 mL/min/1.73 Final         Passed - Completed PHQ-2 or PHQ-9 in the last 360 days

## 2023-04-15 ENCOUNTER — Ambulatory Visit
Admission: RE | Admit: 2023-04-15 | Discharge: 2023-04-15 | Disposition: A | Discharge status: Home / Self Care | Payer: Medicare HMO | Source: Ambulatory Visit | Attending: Internal Medicine | Admitting: Internal Medicine

## 2023-04-15 DIAGNOSIS — Z1231 Encounter for screening mammogram for malignant neoplasm of breast: Secondary | ICD-10-CM | POA: Insufficient documentation

## 2023-04-23 ENCOUNTER — Other Ambulatory Visit: Payer: Self-pay | Admitting: Internal Medicine

## 2023-04-23 DIAGNOSIS — E785 Hyperlipidemia, unspecified: Secondary | ICD-10-CM

## 2023-04-23 NOTE — Telephone Encounter (Signed)
Requested Prescriptions  Pending Prescriptions Disp Refills   atorvastatin (LIPITOR) 10 MG tablet [Pharmacy Med Name: ATORVASTATIN 10 MG TABLET] 90 tablet 0    Sig: TAKE 1 TABLET BY MOUTH EVERY DAY     Cardiovascular:  Antilipid - Statins Failed - 04/23/2023  2:52 AM      Failed - Lipid Panel in normal range within the last 12 months    Cholesterol, Total  Date Value Ref Range Status  06/01/2022 155 100 - 199 mg/dL Final   LDL Chol Calc (NIH)  Date Value Ref Range Status  06/01/2022 91 0 - 99 mg/dL Final   HDL  Date Value Ref Range Status  06/01/2022 41 >39 mg/dL Final   Triglycerides  Date Value Ref Range Status  06/01/2022 126 0 - 149 mg/dL Final         Passed - Patient is not pregnant      Passed - Valid encounter within last 12 months    Recent Outpatient Visits           6 months ago Chronic midline low back pain with bilateral sciatica   [place] [facility], Ocie Bob, MD   8 months ago Chronic midline low back pain with bilateral sciatica   [place] [facility] at [facility], Ocie Bob, MD   10 months ago Annual physical exam   [facility] at [facility], Nyoka Cowden, MD   1 year ago Annual physical exam   [facility] at [facility], Nyoka Cowden, MD   2 years ago Mixed hyperlipidemia   [facility] at [facility], Nyoka Cowden, MD       Future Appointments             In 1 month Judithann Graves, Nyoka Cowden, MD [facility] Medicine at [facility], [facility]

## 2023-05-28 ENCOUNTER — Telehealth: Payer: Self-pay | Admitting: Internal Medicine

## 2023-05-28 NOTE — Telephone Encounter (Signed)
Copied from CRM [id number]. Topic: Medicare AWV >> May 28, 2023 10:11 AM Payton Doughty wrote: Reason for CRM: LVM 05/28/23 to r/s AWV due to [facility] sched change. New AWV date 06/08/2023 at 11am. Please confirm date change [facility]   Verlee Rossetti; Care Guide Ambulatory Clinical Support [facility] Direct Dial: [phone]

## 2023-06-01 NOTE — Progress Notes (Signed)
Date:  06/04/2023   Name:  Erika Collins   DOB:  [birth year]   MRN:  [id number]   Chief Complaint: Annual Exam Erika Collins is a 77 y.o. female who presents today for her Complete Annual Exam. She feels well. She reports exercising online classes, gym sometimes. She reports she is sleeping well. Breast complaints none.  Mammogram: 03/2023 DEXA: 05/2020 osteopenic Colonoscopy: 04/2021  Health Maintenance Due  Topic Date Due   Medicare Annual Wellness (AWV)  05/28/2023    Immunization History  Administered Date(s) Administered   Moderna Covid-19 Vaccine Bivalent Booster 36yrs & up 08/21/2021   Moderna SARS-COV2 Booster Vaccination 08/21/2021   PFIZER(Purple Top)SARS-COV-2 Vaccination 11/10/2019, 12/01/2019, 08/12/2020   Pneumococcal Conjugate-13 05/19/2019   Pneumococcal Polysaccharide-23 05/17/2018   Tdap 01/17/2020    Hyperlipidemia This is a chronic problem. The problem is controlled. Pertinent negatives include no chest pain or shortness of breath. Current antihyperlipidemic treatment includes statins.  Back Pain This is a chronic problem. The problem has been gradually improving since onset. The pain is present in the lumbar spine. Pertinent negatives include no abdominal pain, chest pain, dysuria, fever or headaches.    Lab Results  Component Value Date   NA 138 12/29/2022   K 3.9 12/29/2022   CO2 23 12/29/2022   GLUCOSE 100 (H) 12/29/2022   BUN 13 12/29/2022   CREATININE 0.54 12/29/2022   CALCIUM 9.6 12/29/2022   EGFR 95 06/01/2022   GFRNONAA >60 12/29/2022   Lab Results  Component Value Date   CHOL 155 06/01/2022   HDL 41 06/01/2022   LDLCALC 91 06/01/2022   TRIG 126 06/01/2022   CHOLHDL 3.8 06/01/2022   Lab Results  Component Value Date   TSH 1.130 06/01/2022   Lab Results  Component Value Date   HGBA1C 5.6 06/01/2022   Lab Results  Component Value Date   WBC 5.9 12/29/2022   HGB 12.3 12/29/2022   HCT 37.5 12/29/2022   MCV 101.1 (H) 12/29/2022    PLT 151 12/29/2022   Lab Results  Component Value Date   ALT 28 12/29/2022   AST 32 12/29/2022   ALKPHOS 56 12/29/2022   BILITOT 0.8 12/29/2022   Lab Results  Component Value Date   VD25OH 32.3 05/28/2021     Review of Systems  Constitutional:  Negative for chills, fatigue and fever.  HENT:  Negative for congestion, hearing loss, tinnitus, trouble swallowing and voice change.   Eyes:  Negative for visual disturbance.  Respiratory:  Negative for cough, chest tightness, shortness of breath and wheezing.   Cardiovascular:  Negative for chest pain, palpitations and leg swelling.  Gastrointestinal:  Negative for abdominal pain, constipation, diarrhea and vomiting.  Endocrine: Negative for polydipsia and polyuria.  Genitourinary:  Negative for dysuria, frequency, genital sores, vaginal bleeding and vaginal discharge.  Musculoskeletal:  Positive for back pain. Negative for arthralgias, gait problem and joint swelling.  Skin:  Negative for color change and rash.  Neurological:  Negative for dizziness, tremors, light-headedness and headaches.  Hematological:  Negative for adenopathy. Does not bruise/bleed easily.  Psychiatric/Behavioral:  Negative for dysphoric mood and sleep disturbance. The patient is not nervous/anxious.     Patient Active Problem List   Diagnosis Date Noted   Elevated ferritin 06/04/2023   Chronic radicular lumbar pain 02/02/2023   Spinal stenosis, lumbar region, with neurogenic claudication 02/02/2023   Lumbar degenerative disc disease 02/02/2023   Lumbar spondylosis 12/08/2022   Chronic pain syndrome 12/08/2022   Other idiopathic scoliosis,  lumbar region 12/08/2022   Polyp of transverse colon    Chronic midline low back pain with bilateral sciatica 10/11/2019   OA (osteoarthritis) of finger, unspecified laterality 05/19/2019   Numbness and tingling of foot 05/19/2019   Cervical disc disorder with radiculopathy 05/17/2018   History of TIA (transient ischemic  attack) 05/17/2018   Vitamin D deficiency 05/17/2018   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   Mixed hyperlipidemia 12/13/2017   Tobacco use 12/13/2017   Primary open angle glaucoma 01/13/2013    No Known Allergies  Past Surgical History:  Procedure Laterality Date   APPENDECTOMY  09/11/2021   COLONOSCOPY  02/24/2011   normal, repeat 10 years   COLONOSCOPY WITH PROPOFOL N/A 05/13/2021   Procedure: COLONOSCOPY WITH PROPOFOL;  Surgeon: Pasty Spillers, MD;  Location: [facility];  Service: Endoscopy;  Laterality: N/A;   EYE SURGERY  Medicated Implants Both Eyes   Retina Specialist   LAPAROSCOPIC APPENDECTOMY N/A 09/11/2021   Procedure: APPENDECTOMY LAPAROSCOPIC;  Surgeon: Andria Meuse, MD;  Location: [facility];  Service: General;  Laterality: N/A;   POLYPECTOMY  05/13/2021   Procedure: POLYPECTOMY;  Surgeon: Pasty Spillers, MD;  Location: [facility];  Service: Endoscopy;;   TUBAL LIGATION      Social History   Tobacco Use   Smoking status: Every Day    Current packs/day: 0.25    Average packs/day: 0.3 packs/day for 45.2 years (11.3 ttl pk-yrs)    Types: Cigarettes    Passive exposure: Current   Smokeless tobacco: Never   Tobacco comments:    3-4 cigerettes daily  Vaping Use   Vaping status: Never Used  Substance Use Topics   Alcohol use: Yes    Alcohol/week: 7.0 - 10.0 standard drinks of alcohol    Types: 7 - 10 Glasses of wine per week    Comment: Occasionally enjoy a couple of cocktails on weekend.   Drug use: Never     Medication list has been reviewed and updated.  Current Meds  Medication Sig   aspirin 81 MG tablet Take 81 mg by mouth daily.    atorvastatin (LIPITOR) 10 MG tablet TAKE 1 TABLET BY MOUTH EVERY DAY   brimonidine-timolol (COMBIGAN) 0.2-0.5 % ophthalmic solution Place 1 drop into both eyes every 12 (twelve) hours.   DULoxetine (CYMBALTA) 30 MG capsule TAKE 1 CAPSULE BY MOUTH EVERY DAY   gabapentin (NEURONTIN) 100 MG  capsule TAKE 2 CAPSULES BY MOUTH AT BEDTIME.   RHOPRESSA 0.02 % SOLN    vitamin C (ASCORBIC ACID) 500 MG tablet Take 500 mg by mouth daily.       06/04/2023    8:39 AM 06/01/2022    8:44 AM 05/28/2021    8:42 AM 09/24/2020    8:36 AM  GAD 7 : Generalized Anxiety Score  Nervous, Anxious, on Edge 0 0 0 0  Control/stop worrying 0 0 0 0  Worry too much - different things 0 0 0 0  Trouble relaxing 0 0 0 0  Restless 0 0 0 0  Easily annoyed or irritable 0 0 0 0  Afraid - awful might happen 0 0 0 0  Total GAD 7 Score 0 0 0 0  Anxiety Difficulty Not difficult at all Not difficult at all Not difficult at all        06/04/2023    8:39 AM 12/08/2022   11:12 AM 06/01/2022    8:44 AM  Depression screen PHQ 2/9  Decreased Interest 0 0  0  Down, Depressed, Hopeless 0 0 0  PHQ - 2 Score 0 0 0  Altered sleeping 0  1  Tired, decreased energy 0  0  Change in appetite 0  0  Feeling bad or failure about yourself  0  0  Trouble concentrating 0  0  Moving slowly or fidgety/restless 0  0  Suicidal thoughts 0  0  PHQ-9 Score 0  1  Difficult doing work/chores Not difficult at all  Not difficult at all    BP Readings from Last 3 Encounters:  06/04/23 124/80  02/24/23 (!) 162/83  02/02/23 (!) 142/60    Physical Exam Vitals and nursing note reviewed.  Constitutional:      General: She is not in acute distress.    Appearance: She is well-developed.  HENT:     Head: Normocephalic and atraumatic.     Right Ear: Tympanic membrane and ear canal normal.     Left Ear: Tympanic membrane and ear canal normal.     Nose:     Right Sinus: No maxillary sinus tenderness.     Left Sinus: No maxillary sinus tenderness.  Eyes:     General: No scleral icterus.       Right eye: No discharge.        Left eye: No discharge.     Conjunctiva/sclera: Conjunctivae normal.  Neck:     Thyroid: No thyromegaly.     Vascular: No carotid bruit.  Cardiovascular:     Rate and Rhythm: Normal rate and regular rhythm.      Pulses: Normal pulses.     Heart sounds: Normal heart sounds.  Pulmonary:     Effort: Pulmonary effort is normal. No respiratory distress.     Breath sounds: No wheezing.  Chest:  Breasts:    Right: No mass, nipple discharge, skin change or tenderness.     Left: No mass, nipple discharge, skin change or tenderness.  Abdominal:     General: Bowel sounds are normal.     Palpations: Abdomen is soft.     Tenderness: There is no abdominal tenderness.  Musculoskeletal:     Cervical back: Normal range of motion. No erythema.     Right lower leg: No edema.     Left lower leg: No edema.  Lymphadenopathy:     Cervical: No cervical adenopathy.  Skin:    General: Skin is warm and dry.     Findings: No rash.  Neurological:     Mental Status: She is alert and oriented to person, place, and time.     Cranial Nerves: No cranial nerve deficit.     Sensory: No sensory deficit.     Deep Tendon Reflexes: Reflexes are normal and symmetric.  Psychiatric:        Attention and Perception: Attention normal.        Mood and Affect: Mood normal.     Wt Readings from Last 3 Encounters:  06/04/23 131 lb 6.4 oz (59.6 kg)  02/02/23 130 lb (59 kg)  12/29/22 134 lb (60.8 kg)    BP 124/80   Pulse 78   Ht 5\' 1"  (1.549 m)   Wt 131 lb 6.4 oz (59.6 kg)   SpO2 99%   BMI 24.83 kg/m   Assessment and Plan:  Problem List Items Addressed This Visit       Unprioritized   Vitamin D deficiency (Chronic)    Low vitamin D in the past Not currently taking a supplement Bone  density in 2021 showed osteopenia      Relevant Orders   VITAMIN D 25 Hydroxy (Vit-D Deficiency, Fractures)   Mixed hyperlipidemia (Chronic)    LDL is  Lab Results  Component Value Date   LDLCALC 91 06/01/2022   Currently being treated with atorvastatin with good compliance and no concerns.       Relevant Orders   Comprehensive metabolic panel   Lipid panel   Elevated ferritin    Evaluated by Hematology      Chronic  midline low back pain with bilateral sciatica (Chronic)    On Duloxetine daily for pain management along with gabapentin. Last visit pain was improved, gabapentin dose reduced. Sees Dr. Cherylann Ratel of pain management      Other Visit Diagnoses     Annual physical exam    -  Primary   Ovarian failure due to menopause       continue vitamin D and check level. Will repeat DEXA   Relevant Orders   DG Bone Density       Return in about 1 year (around 06/03/2024) for CPX.    Reubin Milan, MD [facility] [place]

## 2023-06-01 NOTE — Assessment & Plan Note (Signed)
LDL is  Lab Results  Component Value Date   LDLCALC 91 06/01/2022   Currently being treated with atorvastatin with good compliance and no concerns.

## 2023-06-01 NOTE — Assessment & Plan Note (Addendum)
On Duloxetine daily for pain management along with gabapentin. Last visit pain was improved, gabapentin dose reduced. Sees Dr. Cherylann Ratel of pain management

## 2023-06-04 ENCOUNTER — Encounter: Payer: Self-pay | Admitting: Internal Medicine

## 2023-06-04 ENCOUNTER — Ambulatory Visit (INDEPENDENT_AMBULATORY_CARE_PROVIDER_SITE_OTHER): Payer: Medicare HMO | Admitting: Internal Medicine

## 2023-06-04 VITALS — BP 124/80 | HR 78 | Ht 61.0 in | Wt 131.4 lb

## 2023-06-04 DIAGNOSIS — M5442 Lumbago with sciatica, left side: Secondary | ICD-10-CM | POA: Diagnosis not present

## 2023-06-04 DIAGNOSIS — E782 Mixed hyperlipidemia: Secondary | ICD-10-CM

## 2023-06-04 DIAGNOSIS — G8929 Other chronic pain: Secondary | ICD-10-CM

## 2023-06-04 DIAGNOSIS — Z0000 Encounter for general adult medical examination without abnormal findings: Secondary | ICD-10-CM

## 2023-06-04 DIAGNOSIS — E559 Vitamin D deficiency, unspecified: Secondary | ICD-10-CM | POA: Diagnosis not present

## 2023-06-04 DIAGNOSIS — M5441 Lumbago with sciatica, right side: Secondary | ICD-10-CM

## 2023-06-04 DIAGNOSIS — E2839 Other primary ovarian failure: Secondary | ICD-10-CM

## 2023-06-04 DIAGNOSIS — R7989 Other specified abnormal findings of blood chemistry: Secondary | ICD-10-CM | POA: Insufficient documentation

## 2023-06-04 NOTE — Assessment & Plan Note (Signed)
Evaluated by Hematology

## 2023-06-04 NOTE — Assessment & Plan Note (Signed)
Low vitamin D in the past Not currently taking a supplement Bone density in 2021 showed osteopenia

## 2023-06-04 NOTE — Patient Instructions (Signed)
Call [facility] to schedule your bone density at [phone].

## 2023-06-05 LAB — COMPREHENSIVE METABOLIC PANEL
ALT: 30 IU/L (ref 0–32)
AST: 33 IU/L (ref 0–40)
Albumin: 4.2 g/dL (ref 3.8–4.8)
Alkaline Phosphatase: 65 IU/L (ref 44–121)
BUN/Creatinine Ratio: 18 (ref 12–28)
BUN: 11 mg/dL (ref 8–27)
Bilirubin Total: 0.7 mg/dL (ref 0.0–1.2)
CO2: 24 mmol/L (ref 20–29)
Calcium: 10 mg/dL (ref 8.7–10.3)
Chloride: 99 mmol/L (ref 96–106)
Creatinine, Ser: 0.62 mg/dL (ref 0.57–1.00)
Globulin, Total: 2.6 g/dL (ref 1.5–4.5)
Glucose: 98 mg/dL (ref 70–99)
Potassium: 4.6 mmol/L (ref 3.5–5.2)
Sodium: 140 mmol/L (ref 134–144)
Total Protein: 6.8 g/dL (ref 6.0–8.5)
eGFR: 92 mL/min/{1.73_m2} (ref >59)

## 2023-06-05 LAB — LIPID PANEL
Chol/HDL Ratio: 2.8 ratio (ref 0.0–4.4)
Cholesterol, Total: 144 mg/dL (ref 100–199)
HDL: 52 mg/dL (ref >39)
LDL Chol Calc (NIH): 70 mg/dL (ref 0–99)
Triglycerides: 125 mg/dL (ref 0–149)
VLDL Cholesterol Cal: 22 mg/dL (ref 5–40)

## 2023-06-05 LAB — VITAMIN D 25 HYDROXY (VIT D DEFICIENCY, FRACTURES): Vit D, 25-Hydroxy: 24.1 ng/mL — ABNORMAL LOW (ref 30.0–100.0)

## 2023-06-15 ENCOUNTER — Ambulatory Visit: Payer: Medicare HMO

## 2023-06-15 DIAGNOSIS — Z0000 Encounter for general adult medical examination without abnormal findings: Secondary | ICD-10-CM

## 2023-06-15 NOTE — Progress Notes (Signed)
Subjective:   Erika Collins is a 77 y.o. female who presents for Medicare Annual (Subsequent) preventive examination.  Visit Complete: Virtual  I connected with  Erika Collins on 06/15/23 by a audio enabled telemedicine application and verified that I am speaking with the correct person using two identifiers.  Patient Location: Home  Provider Location: Office/Clinic  I discussed the limitations of evaluation and management by telemedicine. The patient expressed understanding and agreed to proceed.  Vital Signs: Unable to obtain new vitals due to this being a telehealth visit.  Cardiac Risk Factors include: advanced age (>35men, >49 women);dyslipidemia     Objective:    There were no vitals filed for this visit. There is no height or weight on file to calculate BMI.     06/15/2023   11:01 AM 12/29/2022   11:08 AM 12/08/2022   11:12 AM 06/24/2022   10:15 AM 05/27/2022   11:24 AM 08/26/2021    1:17 PM 07/23/2021    1:55 PM  Advanced Directives  Does Patient Have a Medical Advance Directive? Yes Yes Yes No Yes Yes Yes  Type of Estate agent of [place];Living will Healthcare Power of [place];Living will Healthcare Power of [place];Living will   Healthcare Power of [place];Living will Healthcare Power of [place];Living will  Does patient want to make changes to medical advance directive? No - Patient declined    No - Patient declined  No - Patient declined  Copy of Healthcare Power of Attorney in Chart? Yes - validated most recent copy scanned in chart (See row information)     Yes - validated most recent copy scanned in chart (See row information)   Would patient like information on creating a medical advance directive?    No - Patient declined       Current Medications (verified) Outpatient Encounter Medications as of 06/15/2023  Medication Sig   aspirin 81 MG tablet Take 81 mg by mouth daily.    atorvastatin (LIPITOR) 10 MG tablet TAKE 1 TABLET BY MOUTH EVERY  DAY   brimonidine-timolol (COMBIGAN) 0.2-0.5 % ophthalmic solution Place 1 drop into both eyes every 12 (twelve) hours.   cholecalciferol (VITAMIN D3) 25 MCG (1000 UNIT) tablet Take 2,000 Units by mouth daily.   DULoxetine (CYMBALTA) 30 MG capsule TAKE 1 CAPSULE BY MOUTH EVERY DAY   gabapentin (NEURONTIN) 100 MG capsule TAKE 2 CAPSULES BY MOUTH AT BEDTIME.   RHOPRESSA 0.02 % SOLN    vitamin C (ASCORBIC ACID) 500 MG tablet Take 500 mg by mouth daily.   No facility-administered encounter medications on file as of 06/15/2023.    Allergies (verified) Patient has no known allergies.   History: Past Medical History:  Diagnosis Date   Allergy    Arthritis    Glaucoma    Hx of transient ischemic attack (TIA)    over 25 yrs ago.  no deficits.   Mass of appendix    Osteoarthritis    Vertebral collapse (HCC)    C3,4,5,6,7 compressed over 50 years ago   Vertigo    over 10 yrs ago   Past Surgical History:  Procedure Laterality Date   APPENDECTOMY  09/11/2021   COLONOSCOPY  02/24/2011   normal, repeat 10 years   COLONOSCOPY WITH PROPOFOL N/A 05/13/2021   Procedure: COLONOSCOPY WITH PROPOFOL;  Surgeon: Pasty Spillers, MD;  Location: [facility];  Service: Endoscopy;  Laterality: N/A;   EYE SURGERY  Medicated Implants Both Eyes   Retina Specialist   LAPAROSCOPIC APPENDECTOMY  N/A 09/11/2021   Procedure: APPENDECTOMY LAPAROSCOPIC;  Surgeon: Andria Meuse, MD;  Location: [facility];  Service: General;  Laterality: N/A;   POLYPECTOMY  05/13/2021   Procedure: POLYPECTOMY;  Surgeon: Pasty Spillers, MD;  Location: [facility];  Service: Endoscopy;;   TUBAL LIGATION     Family History  Problem Relation Age of Onset   Heart attack Father    Hyperlipidemia Sister    Breast cancer Neg Hx    Social History   Socioeconomic History   Marital status: Widowed    Spouse name: Not on file   Number of children: 1   Years of education: some college   Highest  education level: 12th grade  Occupational History   Occupation: Retired  Tobacco Use   Smoking status: Every Day    Current packs/day: 0.25    Average packs/day: 0.3 packs/day for 45.2 years (11.3 ttl pk-yrs)    Types: Cigarettes    Passive exposure: Current   Smokeless tobacco: Never   Tobacco comments:    3-4 cigerettes daily  Vaping Use   Vaping status: Never Used  Substance and Sexual Activity   Alcohol use: Yes    Alcohol/week: 7.0 - 10.0 standard drinks of alcohol    Types: 7 - 10 Glasses of wine per week    Comment: Occasionally enjoy a couple of cocktails on weekend.   Drug use: Never   Sexual activity: Not Currently  Other Topics Concern   Not on file  Social History Narrative   Pt lives alone.    Social Determinants of Health   Financial Resource Strain: Low Risk  (06/15/2023)   Overall Financial Resource Strain (CARDIA)    Difficulty of Paying Living Expenses: Not hard at all  Food Insecurity: No Food Insecurity (06/15/2023)   Hunger Vital Sign    Worried About Running Out of Food in the Last Year: Never true    Ran Out of Food in the Last Year: Never true  Transportation Needs: No Transportation Needs (06/15/2023)   PRAPARE - Administrator, Civil Service (Medical): No    Lack of Transportation (Non-Medical): No  Physical Activity: Sufficiently Active (06/15/2023)   Exercise Vital Sign    Days of Exercise per Week: 3 days    Minutes of Exercise per Session: 60 min  Stress: No Stress Concern Present (06/15/2023)   [employer] of Occupational Health - Occupational Stress Questionnaire    Feeling of Stress : Not at all  Social Connections: Socially Isolated (06/15/2023)   Social Connection and Isolation Panel [NHANES]    Frequency of Communication with Friends and Family: More than three times a week    Frequency of Social Gatherings with Friends and Family: Twice a week    Attends Religious Services: Never    Database administrator or  Organizations: No    Attends Banker Meetings: Never    Marital Status: Widowed    Tobacco Counseling Ready to quit: Not Answered Counseling given: Not Answered Tobacco comments: 3-4 cigerettes daily   Clinical Intake:  Pre-visit preparation completed: Yes  Pain : No/denies pain     Nutritional Status: BMI of 19-24  Normal Nutritional Risks: None Diabetes: No  How often do you need to have someone help you when you read instructions, pamphlets, or other written materials from your doctor or pharmacy?: 1 - Never  Interpreter Needed?: No  Information entered by :: Kennedy Bucker, LPN   Activities of Daily Living  06/15/2023   11:02 AM 06/11/2023   12:14 PM  In your present state of health, do you have any difficulty performing the following activities:  Hearing? 0 0  Vision? 1 0  Difficulty concentrating or making decisions? 0 0  Walking or climbing stairs? 1 1  Comment lbp, hips   Dressing or bathing? 0 0  Doing errands, shopping? 0 0  Preparing Food and eating ? N N  Using the Toilet? N N  In the past six months, have you accidently leaked urine? N N  Do you have problems with loss of bowel control? N N  Managing your Medications? N N  Managing your Finances? N N  Housekeeping or managing your Housekeeping? N N    Patient Care Team: Reubin Milan, MD as PCP - General (Internal Medicine) Sherlyn Lees Lavella Hammock, MD as Consulting Physician (Ophthalmology) Dr. Alvester Chou as Consulting Physician (Retina Ophthalmology) Pasty Spillers, MD (Inactive) as Consulting Physician (Gastroenterology)  Indicate any recent Medical Services you may have received from other than [facility] providers in the past year (date may be approximate).     Assessment:   This is a routine wellness examination for [place].  Hearing/Vision screen Hearing Screening - Comments:: No aids Vision Screening - Comments:: Readers- glaucoma Dr.Vin in [place]   Goals  Addressed             This Visit's Progress    DIET - EAT MORE FRUITS AND VEGETABLES        Depression Screen    06/15/2023   11:00 AM 06/04/2023    8:39 AM 12/08/2022   11:12 AM 06/01/2022    8:44 AM 05/27/2022   11:23 AM 05/28/2021    8:42 AM 05/21/2021   11:33 AM  PHQ 2/9 Scores  PHQ - 2 Score 0 0 0 0 0 0 0  PHQ- 9 Score 0 0  1 2 0 0    Fall Risk    06/15/2023   11:02 AM 06/11/2023   12:14 PM 06/04/2023    8:40 AM 02/02/2023   12:52 PM 12/08/2022   11:11 AM  Fall Risk   Falls in the past year? 0 0 0 0 0  Number falls in past yr: 0  0 0   Injury with Fall? 0  0 0   Risk for fall due to : No Fall Risks      Follow up Falls prevention discussed;Falls evaluation completed        MEDICARE RISK AT HOME: Medicare Risk at Home Any stairs in or around the home?: No If so, are there any without handrails?: No Home free of loose throw rugs in walkways, pet beds, electrical cords, etc?: Yes Adequate lighting in your home to reduce risk of falls?: Yes Life alert?: No Use of a cane, walker or w/c?: Yes (long distance uses walker) Grab bars in the bathroom?: Yes Shower chair or bench in shower?: Yes Elevated toilet seat or a handicapped toilet?: No  TIMED UP AND GO:  Was the test performed?  No    Cognitive Function:        06/15/2023   11:04 AM 05/27/2022   11:26 AM 05/20/2020   11:34 AM 05/17/2019   11:33 AM 05/11/2018   11:24 AM  6CIT Screen  What Year? 0 points 0 points 0 points 0 points 0 points  What month? 0 points 0 points 0 points 0 points 0 points  What time? 0 points 0 points 0 points 0  points 0 points  Count back from 20 0 points 0 points 0 points 0 points 0 points  Months in reverse 0 points 0 points 0 points 0 points 0 points  Repeat phrase 0 points 0 points 0 points 2 points 0 points  Total Score 0 points 0 points 0 points 2 points 0 points    Immunizations Immunization History  Administered Date(s) Administered   Moderna Covid-19 Vaccine Bivalent Booster  53yrs & up 08/21/2021   Moderna SARS-COV2 Booster Vaccination 08/21/2021   PFIZER(Purple Top)SARS-COV-2 Vaccination 11/10/2019, 12/01/2019, 08/12/2020   Pneumococcal Conjugate-13 05/19/2019   Pneumococcal Polysaccharide-23 05/17/2018   Tdap 01/17/2020    TDAP status: Up to date  Flu Vaccine status: Declined, Education has been provided regarding the importance of this vaccine but patient still declined. Advised may receive this vaccine at local pharmacy or Health Dept. Aware to provide a copy of the vaccination record if obtained from local pharmacy or Health Dept. Verbalized acceptance and understanding.  Pneumococcal vaccine status: Up to date  Covid-19 vaccine status: Completed vaccines  Qualifies for Shingles Vaccine? Yes   Zostavax completed No   Shingrix Completed?: No.    Education has been provided regarding the importance of this vaccine. Patient has been advised to call insurance company to determine out of pocket expense if they have not yet received this vaccine. Advised may also receive vaccine at local pharmacy or Health Dept. Verbalized acceptance and understanding.  Screening Tests Health Maintenance  Topic Date Due   INFLUENZA VACCINE  12/20/2023 (Originally 04/22/2023)   MAMMOGRAM  04/14/2024   Medicare Annual Wellness (AWV)  06/14/2024   DTaP/Tdap/Td (2 - Td or Tdap) 01/16/2030   Pneumonia Vaccine 66+ Years old  Completed   DEXA SCAN  Completed   Hepatitis C Screening  Completed   HPV VACCINES  Aged Out   Colonoscopy  Discontinued   COVID-19 Vaccine  Discontinued   Zoster Vaccines- Shingrix  Discontinued    Health Maintenance  There are no preventive care reminders to display for this patient.   Colorectal cancer screening: No longer required.   Mammogram status: No longer required due to age.  Bone Density status: Ordered 06/04/23. Pt provided with contact info and advised to call to schedule appt.  Lung Cancer Screening: (Low Dose CT Chest recommended  if Age 55-80 years, 20 pack-year currently smoking OR have quit w/in 15years.) does not qualify.    Additional Screening:  Hepatitis C Screening: does qualify; Completed 07/07/21  Vision Screening: Recommended annual ophthalmology exams for early detection of glaucoma and other disorders of the eye. Is the patient up to date with their annual eye exam?  Yes  Who is the provider or what is the name of the office in which the patient attends annual eye exams? Dr.Vin If pt is not established with a provider, would they like to be referred to a provider to establish care? No .   Dental Screening: Recommended annual dental exams for proper oral hygiene   Community Resource Referral / Chronic Care Management: CRR required this visit?  No   CCM required this visit?  No     Plan:     I have personally reviewed and noted the following in the patient's chart:   Medical and social history Use of alcohol, tobacco or illicit drugs  Current medications and supplements including opioid prescriptions. Patient is not currently taking opioid prescriptions. Functional ability and status Nutritional status Physical activity Advanced directives List of other physicians Hospitalizations, surgeries,  and ER visits in previous 12 months Vitals Screenings to include cognitive, depression, and falls Referrals and appointments  In addition, I have reviewed and discussed with patient certain preventive protocols, quality metrics, and best practice recommendations. A written personalized care plan for preventive services as well as general preventive health recommendations were provided to patient.     Hal Hope, LPN   [id number]   After Visit Summary: (MyChart) Due to this being a telephonic visit, the after visit summary with patients personalized plan was offered to patient via MyChart   Nurse Notes: none

## 2023-06-15 NOTE — Patient Instructions (Addendum)
Erika Collins , Thank you for taking time to come for your Medicare Wellness Visit. I appreciate your ongoing commitment to your health goals. Please review the following plan we discussed and let me know if I can assist you in the future.   Referrals/Orders/Follow-Ups/Clinician Recommendations: none  This is a list of the screening recommended for you and due dates:  Health Maintenance  Topic Date Due   Flu Shot  12/20/2023*   Mammogram  04/14/2024   Medicare Annual Wellness Visit  06/14/2024   DTaP/Tdap/Td vaccine (2 - Td or Tdap) 01/16/2030   Pneumonia Vaccine  Completed   DEXA scan (bone density measurement)  Completed   Hepatitis C Screening  Completed   HPV Vaccine  Aged Out   Colon Cancer Screening  Discontinued   COVID-19 Vaccine  Discontinued   Zoster (Shingles) Vaccine  Discontinued  *Topic was postponed. The date shown is not the original due date.    Advanced directives: (In Chart) A copy of your advanced directives are scanned into your chart should your provider ever need it.  Next Medicare Annual Wellness Visit scheduled for next year: Yes   06/20/24 @ 10:30 am by video

## 2023-06-21 ENCOUNTER — Ambulatory Visit
Admission: EM | Admit: 2023-06-21 | Discharge: 2023-06-21 | Disposition: A | Discharge status: Home / Self Care | Payer: Medicare HMO | Attending: Emergency Medicine | Admitting: Emergency Medicine

## 2023-06-21 ENCOUNTER — Ambulatory Visit (INDEPENDENT_AMBULATORY_CARE_PROVIDER_SITE_OTHER): Payer: Medicare HMO

## 2023-06-21 ENCOUNTER — Encounter: Payer: Self-pay | Admitting: Emergency Medicine

## 2023-06-21 DIAGNOSIS — M47812 Spondylosis without myelopathy or radiculopathy, cervical region: Secondary | ICD-10-CM | POA: Diagnosis not present

## 2023-06-21 DIAGNOSIS — M542 Cervicalgia: Secondary | ICD-10-CM | POA: Diagnosis not present

## 2023-06-21 DIAGNOSIS — S161XXA Strain of muscle, fascia and tendon at neck level, initial encounter: Secondary | ICD-10-CM

## 2023-06-21 DIAGNOSIS — R599 Enlarged lymph nodes, unspecified: Secondary | ICD-10-CM

## 2023-06-21 DIAGNOSIS — R591 Generalized enlarged lymph nodes: Secondary | ICD-10-CM | POA: Diagnosis not present

## 2023-06-21 DIAGNOSIS — M8580 Other specified disorders of bone density and structure, unspecified site: Secondary | ICD-10-CM | POA: Diagnosis not present

## 2023-06-21 MED ORDER — ACETAMINOPHEN 325 MG PO TABS
975.0000 mg | ORAL_TABLET | Freq: Once | ORAL | Status: AC
  Administered 2023-06-21: 975 mg via ORAL

## 2023-06-21 MED ORDER — TIZANIDINE HCL 2 MG PO TABS: 2.0000 mg | ORAL_TABLET | Freq: Three times a day (TID) | ORAL | 0 refills | Status: DC | PRN

## 2023-06-21 MED ORDER — IBUPROFEN 400 MG PO TABS
400.0000 mg | ORAL_TABLET | Freq: Once | ORAL | Status: AC
  Administered 2023-06-21: 400 mg via ORAL

## 2023-06-21 NOTE — ED Provider Notes (Incomplete)
HPI  SUBJECTIVE:  Erika Collins is a 77 y.o. female who presents with severe 10/10 neck pain, and inability to move her neck and a bilateral occipital headache after rolling over in bed 4 days ago.  She states that she heard a "crack" that was followed by throbbing, pulsating, constant neck pain shortly thereafter.  She denies immediate onset of neck pain.  She states that the pain radiates into both of her shoulders and that she cannot rotate/flex or extend her neck.  No nausea,, fevers, visual changes, facial numbness, tingling, drooping, extremity numbness, tingling, weakness, slurred speech, aphasia.  No trauma to her head or neck.  She has tried cold, heat, ibuprofen 400 mg without improvement in her symptoms.  Last dose was within 6 hours of evaluation.  Symptoms are worse when she tries to move her neck. Patient has a past medical history of TIA, C3/C4/C5/C6/C7 vertebral collapse over 50 years ago after falling down a flight of stairs, vertigo, diffuse osteoarthritis, spinal stenosis.  She is on a daily aspirin.  No history of osteoporosis, hypertension, diabetes, chronic kidney disease, cancer.  PCP: [facility].  Past Medical History:  Diagnosis Date   Allergy    Arthritis    Glaucoma    Hx of transient ischemic attack (TIA)    over 25 yrs ago.  no deficits.   Mass of appendix    Osteoarthritis    Vertebral collapse (HCC)    C3,4,5,6,7 compressed over 50 years ago   Vertigo    over 10 yrs ago    Past Surgical History:  Procedure Laterality Date   APPENDECTOMY  09/11/2021   COLONOSCOPY  02/24/2011   normal, repeat 10 years   COLONOSCOPY WITH PROPOFOL N/A 05/13/2021   Procedure: COLONOSCOPY WITH PROPOFOL;  Surgeon: Pasty Spillers, MD;  Location: [facility];  Service: Endoscopy;  Laterality: N/A;   EYE SURGERY  Medicated Implants Both Eyes   Retina Specialist   LAPAROSCOPIC APPENDECTOMY N/A 09/11/2021   Procedure: APPENDECTOMY LAPAROSCOPIC;  Surgeon: Andria Meuse, MD;  Location: [facility];  Service: General;  Laterality: N/A;   POLYPECTOMY  05/13/2021   Procedure: POLYPECTOMY;  Surgeon: Pasty Spillers, MD;  Location: [facility];  Service: Endoscopy;;   TUBAL LIGATION      Family History  Problem Relation Age of Onset   Heart attack Father    Hyperlipidemia Sister    Breast cancer Neg Hx     Social History   Tobacco Use   Smoking status: Every Day    Current packs/day: 0.25    Average packs/day: 0.3 packs/day for 45.2 years (11.3 ttl pk-yrs)    Types: Cigarettes    Passive exposure: Current   Smokeless tobacco: Never   Tobacco comments:    3-4 cigerettes daily  Vaping Use   Vaping status: Never Used  Substance Use Topics   Alcohol use: Yes    Alcohol/week: 7.0 - 10.0 standard drinks of alcohol    Types: 7 - 10 Glasses of wine per week    Comment: Occasionally enjoy a couple of cocktails on weekend.   Drug use: Never    No current facility-administered medications for this encounter.  Current Outpatient Medications:    tiZANidine (ZANAFLEX) 2 MG tablet, Take 1 tablet (2 mg total) by mouth every 8 (eight) hours as needed for muscle spasms. Take 1 to 2 tablets every 8 hours as needed, Disp: 20 tablet, Rfl: 0   aspirin 81 MG tablet, Take 81  mg by mouth daily. , Disp: , Rfl:    atorvastatin (LIPITOR) 10 MG tablet, TAKE 1 TABLET BY MOUTH EVERY DAY, Disp: 90 tablet, Rfl: 0   brimonidine-timolol (COMBIGAN) 0.2-0.5 % ophthalmic solution, Place 1 drop into both eyes every 12 (twelve) hours., Disp: , Rfl:    cholecalciferol (VITAMIN D3) 25 MCG (1000 UNIT) tablet, Take 2,000 Units by mouth daily., Disp: , Rfl:    DULoxetine (CYMBALTA) 30 MG capsule, TAKE 1 CAPSULE BY MOUTH EVERY DAY, Disp: 90 capsule, Rfl: 1   gabapentin (NEURONTIN) 100 MG capsule, TAKE 2 CAPSULES BY MOUTH AT BEDTIME., Disp: 180 capsule, Rfl: 1   RHOPRESSA 0.02 % SOLN, , Disp: , Rfl:    vitamin C (ASCORBIC ACID) 500 MG tablet, Take 500 mg by mouth  daily., Disp: , Rfl:   No Known Allergies   ROS  As noted in HPI.   Physical Exam  BP (!) 169/72 (BP Location: Left Arm)   Pulse 73   Temp 98.3 F (36.8 C) (Oral)   Resp 18   SpO2 99%   Constitutional: Well developed, well nourished, appears uncomfortable.  Eyes:  EOMI, conjunctiva normal bilaterally, PERRLA HENT: Normocephalic, atraumatic,mucus membranes moist.  No facial droop.  Speech clear. Neck: Diffuse C-spine tenderness, bilateral trapezius spasm, tenderness.  Patient is absolutely unable to move her head.  Significant pain with any passive range of motion. Respiratory: Normal inspiratory effort Cardiovascular: Normal rate GI: nondistended skin: No rash, skin intact Musculoskeletal: no deformities Neurologic: Alert & oriented x 3, no focal neuro deficits.  Sensation and strength in upper/lower extremities intact and equal bilaterally. Psychiatric: Speech and behavior appropriate   ED Course   Medications  acetaminophen (TYLENOL) tablet 975 mg (975 mg Oral Given 06/21/23 1659)  ibuprofen (ADVIL) tablet 400 mg (400 mg Oral Given 06/21/23 1659)    Orders Placed This Encounter  Procedures   CT Cervical Spine Wo Contrast    History of multiple vertebral collapse in the C-spine. Rolled over in bed, felt a pop 4 days ago, now has no neck range of motion. Diffuse C-spine tenderness, trapezial tenderness, spasm. Rule out acute changes.    Standing Status:   Standing    Number of Occurrences:   1   Apply cervical collar soft    Standing Status:   Standing    Number of Occurrences:   1    No results found for this or any previous visit (from the past 24 hour(s)). CT Cervical Spine Wo Contrast  Result Date: 06/21/2023 CLINICAL DATA:  Rolled over in bed and felt a pop 4 days ago. Decreased range of motion. Diffuse C-spine tenderness. EXAM: CT CERVICAL SPINE WITHOUT CONTRAST TECHNIQUE: Multidetector CT imaging of the cervical spine was performed without intravenous  contrast. Multiplanar CT image reconstructions were also generated. RADIATION DOSE REDUCTION: This exam was performed according to the departmental dose-optimization program which includes automated exposure control, adjustment of the mA and/or kV according to patient size and/or use of iterative reconstruction technique. COMPARISON:  None Available. FINDINGS: Alignment: No acute subluxation. There is straightening of normal cervical lordosis which may be positional or due to muscle spasm. Skull base and vertebrae: No acute fracture.  Osteopenia. Soft tissues and spinal canal: No prevertebral fluid or swelling. No visible canal hematoma. Disc levels:  No acute findings.  Multilevel degenerative changes. Upper chest: Negative. Other: Top-normal left supraclavicular lymph node measures 8 mm short axis (74/3). Additional mildly enlarged lymph lymph node in the upper mediastinum to the left  of the trachea (79/3) measures approximately 1 cm. Clinical correlation is recommended. IMPRESSION: 1. No acute/traumatic cervical spine pathology. 2. Mildly enlarged left supraclavicular and upper mediastinal lymph nodes. Electronically Signed   By: Elgie Collard M.D.   On: 06/21/2023 18:45    ED Clinical Impression  1. Acute strain of neck muscle, initial encounter   2. Arthritis of neck   3. Lymph node enlargement      ED Assessment/Plan     I do not think that plain films would be useful given there low sensitivity and her history of extensive cervical spine injury.  Will get noncontrast CT of the neck to evaluate for any acute changes.  Will give 400 mg of ibuprofen combined with 975 mg of Tylenol here.  She took ibuprofen 400 mg about 5 hours ago.  She denies head injury, she has no focal neurologic defects.  Doubt ICH, SAH.  Deferring head CT.  Advised if I found any abnormalities, and that it may be transferring her to the emergency department.  She would like to proceed with a workup here.  Calculated  creatinine clearance from labs done on September 13 71 mL/min.  Reevaluation, she states that she feels much better.  She is able to move her neck some, and her headache is improving.  Reviewed imaging independently.  Straightening of the normal cervical lordosis which is most likely due to muscle spasm.  No fracture.  Multilevel degenerative changes.  left supraclavicular lymph node 8 mm with a mildly enlarged lymph node in upper mediastinum to the left of the trachea 1 cm.  See radiology report for full details.  Home with Tylenol/ibuprofen and Zanaflex 2 to 4 mg 3 times daily as needed.  Heat or ice, whichever feels better.  She will need to follow-up with her PCP about the lymph nodes.  Soft neck collar as needed for comfort.  Discussed imaging, MDM, treatment plan, and plan for follow-up with patient. Discussed sn/sx that should prompt return to the ED. patient agrees with plan.   Meds ordered this encounter  Medications   acetaminophen (TYLENOL) tablet 975 mg   ibuprofen (ADVIL) tablet 400 mg   tiZANidine (ZANAFLEX) 2 MG tablet    Sig: Take 1 tablet (2 mg total) by mouth every 8 (eight) hours as needed for muscle spasms. Take 1 to 2 tablets every 8 hours as needed    Dispense:  20 tablet    Refill:  0      *This clinic note was created using Scientist, clinical (histocompatibility and immunogenetics). Therefore, there may be occasional mistakes despite careful proofreading.  ?    Domenick Gong, MD 06/22/23 1610    Domenick Gong, MD 06/22/23 [phone]

## 2023-06-21 NOTE — Discharge Instructions (Signed)
Your CT was negative for any acute fracture or dislocation.  You have multilevel arthritic changes.  You have an 8 mm supraclavicular lymph node on the left side and a mildly enlarged lymph node on the upper mediastinum to the left of the trachea measuring approximately 8 cm.  I do not know what this is from, but this was an incidental finding on today's CT.  It may have been there for some time.  I would follow-up with your primary care provider for further evaluation ASAP.  Go to the ER for worsening headache, fevers above 100.4, numbness, tingling or weakness in your extremities, face, visual changes, or for other concerns.  You can wear soft cervical collar as needed for support.  400 mg of ibuprofen combined with 1000 mg of Tylenol 3 times a day for the next 4 to 5 days.  I would discontinue the ibuprofen after that.  Zanaflex 2 to 4 mg 3 times a day for muscle spasm.

## 2023-06-21 NOTE — ED Triage Notes (Signed)
Pt states 4 days ago she went to turn over in bed and heard a crack in her head. She has throbbing pain in her head and neck.

## 2023-06-21 NOTE — ED Notes (Signed)
CT Cervical Spine WO Contrast approved for 06/21/23 ref# [id number]. Spoke with Irine Seal. with [facility]

## 2023-06-22 ENCOUNTER — Telehealth: Payer: Self-pay | Admitting: Internal Medicine

## 2023-06-22 NOTE — Telephone Encounter (Signed)
Copied from CRM [id number]. Topic: Appointment Scheduling - Scheduling Inquiry for Clinic >> Jun 22, 2023 11:41 AM Erika Collins wrote: Patient called to see if Dr. Judithann Graves could read her CT scan from 9/30 and advise if she needs to come see her for a follow up or another provider.

## 2023-06-23 NOTE — Telephone Encounter (Signed)
Scheduled for Monday at 1:40 PM

## 2023-06-24 DIAGNOSIS — H35373 Puckering of macula, bilateral: Secondary | ICD-10-CM | POA: Diagnosis not present

## 2023-06-24 DIAGNOSIS — H44113 Panuveitis, bilateral: Secondary | ICD-10-CM | POA: Diagnosis not present

## 2023-06-28 ENCOUNTER — Ambulatory Visit (INDEPENDENT_AMBULATORY_CARE_PROVIDER_SITE_OTHER): Payer: Medicare HMO | Admitting: Internal Medicine

## 2023-06-28 VITALS — BP 132/70 | HR 59 | Ht 61.0 in | Wt 131.4 lb

## 2023-06-28 DIAGNOSIS — R599 Enlarged lymph nodes, unspecified: Secondary | ICD-10-CM | POA: Diagnosis not present

## 2023-06-28 DIAGNOSIS — S161XXD Strain of muscle, fascia and tendon at neck level, subsequent encounter: Secondary | ICD-10-CM

## 2023-06-28 NOTE — Progress Notes (Signed)
Date:  06/28/2023   Name:  Erika Collins   DOB:  1946/01/08   MRN:  [id number]   Chief Complaint: Neck Pain (Discuss Ct of Cervical Spine) Had CT scan of C Spine 06/21/23 in the ER for acute neck pain. Finding included supraclavicular lymph node for which clinical correlation is recommended. Top-normal left supraclavicular lymph node measures 8 mm short axis (74/3). Additional mildly enlarged lymph lymph node in the upper mediastinum to the left of the trachea (79/3) measures approximately 1 cm. Clinical correlation is recommended. She denies any sinus, dental, skin inflammation or infection.  No left sided mass or fullness.  Neck Pain  This is a new problem. The current episode started in the past 7 days. The problem has been gradually improving. The pain is associated with a sleep position. The pain is present in the left side. The quality of the pain is described as burning and cramping. The pain is moderate. She has tried neck support, home exercises, heat, muscle relaxants and NSAIDs for the symptoms. The treatment provided moderate relief.    Lab Results  Component Value Date   NA 140 06/04/2023   K 4.6 06/04/2023   CO2 24 06/04/2023   GLUCOSE 98 06/04/2023   BUN 11 06/04/2023   CREATININE 0.62 06/04/2023   CALCIUM 10.0 06/04/2023   EGFR 92 06/04/2023   GFRNONAA >60 12/29/2022   Lab Results  Component Value Date   CHOL 144 06/04/2023   HDL 52 06/04/2023   LDLCALC 70 06/04/2023   TRIG 125 06/04/2023   CHOLHDL 2.8 06/04/2023   Lab Results  Component Value Date   TSH 1.130 06/01/2022   Lab Results  Component Value Date   HGBA1C 5.6 06/01/2022   Lab Results  Component Value Date   WBC 5.9 12/29/2022   HGB 12.3 12/29/2022   HCT 37.5 12/29/2022   MCV 101.1 (H) 12/29/2022   PLT 151 12/29/2022   Lab Results  Component Value Date   ALT 30 06/04/2023   AST 33 06/04/2023   ALKPHOS 65 06/04/2023   BILITOT 0.7 06/04/2023   Lab Results  Component Value Date    VD25OH 24.1 (L) 06/04/2023     Patient Active Problem List   Diagnosis Date Noted   Elevated ferritin 06/04/2023   Chronic radicular lumbar pain 02/02/2023   Spinal stenosis, lumbar region, with neurogenic claudication 02/02/2023   Lumbar degenerative disc disease 02/02/2023   Lumbar spondylosis 12/08/2022   Chronic pain syndrome 12/08/2022   Other idiopathic scoliosis, lumbar region 12/08/2022   Polyp of transverse colon    Chronic midline low back pain with bilateral sciatica 10/11/2019   OA (osteoarthritis) of finger, unspecified laterality 05/19/2019   Numbness and tingling of foot 05/19/2019   Cervical disc disorder with radiculopathy 05/17/2018   History of TIA (transient ischemic attack) 05/17/2018   Vitamin D deficiency 05/17/2018   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   Mixed hyperlipidemia 12/13/2017   Tobacco use 12/13/2017   Primary open angle glaucoma 01/13/2013    No Known Allergies  Past Surgical History:  Procedure Laterality Date   APPENDECTOMY  09/11/2021   COLONOSCOPY  02/24/2011   normal, repeat 10 years   COLONOSCOPY WITH PROPOFOL N/A 05/13/2021   Procedure: COLONOSCOPY WITH PROPOFOL;  Surgeon: Pasty Spillers, MD;  Location: [facility];  Service: Endoscopy;  Laterality: N/A;   EYE SURGERY  Medicated Implants Both Eyes   Retina Specialist   LAPAROSCOPIC APPENDECTOMY N/A 09/11/2021   Procedure: APPENDECTOMY  LAPAROSCOPIC;  Surgeon: Andria Meuse, MD;  Location: [facility];  Service: General;  Laterality: N/A;   POLYPECTOMY  05/13/2021   Procedure: POLYPECTOMY;  Surgeon: Pasty Spillers, MD;  Location: [facility];  Service: Endoscopy;;   TUBAL LIGATION      Social History   Tobacco Use   Smoking status: Every Day    Current packs/day: 0.25    Average packs/day: 0.3 packs/day for 45.2 years (11.3 ttl pk-yrs)    Types: Cigarettes    Passive exposure: Current   Smokeless tobacco: Never   Tobacco comments:    3-4  cigerettes daily  Vaping Use   Vaping status: Never Used  Substance Use Topics   Alcohol use: Yes    Alcohol/week: 7.0 - 10.0 standard drinks of alcohol    Types: 7 - 10 Glasses of wine per week    Comment: Occasionally enjoy a couple of cocktails on weekend.   Drug use: Never     Medication list has been reviewed and updated.  Current Meds  Medication Sig   aspirin 81 MG tablet Take 81 mg by mouth daily.    atorvastatin (LIPITOR) 10 MG tablet TAKE 1 TABLET BY MOUTH EVERY DAY   brimonidine-timolol (COMBIGAN) 0.2-0.5 % ophthalmic solution Place 1 drop into both eyes every 12 (twelve) hours.   cholecalciferol (VITAMIN D3) 25 MCG (1000 UNIT) tablet Take 2,000 Units by mouth daily.   DULoxetine (CYMBALTA) 30 MG capsule TAKE 1 CAPSULE BY MOUTH EVERY DAY   gabapentin (NEURONTIN) 100 MG capsule TAKE 2 CAPSULES BY MOUTH AT BEDTIME.   RHOPRESSA 0.02 % SOLN    tiZANidine (ZANAFLEX) 2 MG tablet Take 1 tablet (2 mg total) by mouth every 8 (eight) hours as needed for muscle spasms. Take 1 to 2 tablets every 8 hours as needed   vitamin C (ASCORBIC ACID) 500 MG tablet Take 500 mg by mouth daily.       06/28/2023    1:38 PM 06/04/2023    8:39 AM 06/01/2022    8:44 AM 05/28/2021    8:42 AM  GAD 7 : Generalized Anxiety Score  Nervous, Anxious, on Edge 0 0 0 0  Control/stop worrying 0 0 0 0  Worry too much - different things 0 0 0 0  Trouble relaxing 0 0 0 0  Restless 0 0 0 0  Easily annoyed or irritable 0 0 0 0  Afraid - awful might happen 0 0 0 0  Total GAD 7 Score 0 0 0 0  Anxiety Difficulty Not difficult at all Not difficult at all Not difficult at all Not difficult at all       06/28/2023    1:38 PM 06/15/2023   11:00 AM 06/04/2023    8:39 AM  Depression screen PHQ 2/9  Decreased Interest 0 0 0  Down, Depressed, Hopeless 0 0 0  PHQ - 2 Score 0 0 0  Altered sleeping 0 0 0  Tired, decreased energy 0 0 0  Change in appetite 0 0 0  Feeling bad or failure about yourself  0 0 0  Trouble  concentrating 0 0 0  Moving slowly or fidgety/restless 0 0 0  Suicidal thoughts 0 0 0  PHQ-9 Score 0 0 0  Difficult doing work/chores Not difficult at all Not difficult at all Not difficult at all    BP Readings from Last 3 Encounters:  06/28/23 132/70  06/21/23 (!) 169/72  06/04/23 124/80    Physical Exam Constitutional:  Appearance: Normal appearance.  HENT:     Nose:     Right Sinus: No maxillary sinus tenderness or frontal sinus tenderness.     Left Sinus: No maxillary sinus tenderness or frontal sinus tenderness.  Neck:     Thyroid: No thyromegaly.     Trachea: Trachea normal.     Comments: No supraclavicular fullness Cardiovascular:     Rate and Rhythm: Normal rate and regular rhythm.     Pulses: Normal pulses.     Heart sounds: No murmur heard. Pulmonary:     Effort: Pulmonary effort is normal.     Breath sounds: Normal breath sounds.  Musculoskeletal:     Cervical back: Tenderness present.  Lymphadenopathy:     Cervical: No cervical adenopathy.  Neurological:     Mental Status: She is alert.     Wt Readings from Last 3 Encounters:  06/28/23 131 lb 6.4 oz (59.6 kg)  06/04/23 131 lb 6.4 oz (59.6 kg)  02/02/23 130 lb (59 kg)    BP 132/70   Pulse (!) 59   Ht 5\' 1"  (1.549 m)   Wt 131 lb 6.4 oz (59.6 kg)   SpO2 98%   BMI 24.83 kg/m   Assessment and Plan:  Problem List Items Addressed This Visit   None Visit Diagnoses     Enlarged lymph nodes    -  Primary   2 upper normal sized nodes - left supraclavicular and mediastinal both asym; risk - tobacco use Pt will monitor for enlarge node on left and f/u if changes   Strain of neck muscle, subsequent encounter       improving with heat, rest, muscle relaxants and Advil follow up if no further improvement       No follow-ups on file.    Reubin Milan, MD [facility] [place]

## 2023-06-28 NOTE — Assessment & Plan Note (Signed)
Repeat imagine if palpable or any symptoms occur Consider LDCT screening due to smoking

## 2023-06-29 ENCOUNTER — Encounter: Payer: Self-pay | Admitting: Internal Medicine

## 2023-06-30 ENCOUNTER — Inpatient Hospital Stay: Payer: Medicare HMO | Attending: Oncology

## 2023-06-30 DIAGNOSIS — R7989 Other specified abnormal findings of blood chemistry: Secondary | ICD-10-CM | POA: Diagnosis not present

## 2023-06-30 LAB — CBC WITH DIFFERENTIAL (CANCER CENTER ONLY)
Abs Immature Granulocytes: 0.02 10*3/uL (ref 0.00–0.07)
Basophils Absolute: 0 10*3/uL (ref 0.0–0.1)
Basophils Relative: 1 %
Eosinophils Absolute: 0.4 10*3/uL (ref 0.0–0.5)
Eosinophils Relative: 7 %
HCT: 37.4 % (ref 36.0–46.0)
Hemoglobin: 12.2 g/dL (ref 12.0–15.0)
Immature Granulocytes: 0 %
Lymphocytes Relative: 20 %
Lymphs Abs: 1.1 10*3/uL (ref 0.7–4.0)
MCH: 33.2 pg (ref 26.0–34.0)
MCHC: 32.6 g/dL (ref 30.0–36.0)
MCV: 101.6 fL — ABNORMAL HIGH (ref 80.0–100.0)
Monocytes Absolute: 0.6 10*3/uL (ref 0.1–1.0)
Monocytes Relative: 12 %
Neutro Abs: 3.4 10*3/uL (ref 1.7–7.7)
Neutrophils Relative %: 60 %
Platelet Count: 188 10*3/uL (ref 150–400)
RBC: 3.68 MIL/uL — ABNORMAL LOW (ref 3.87–5.11)
RDW: 12.3 % (ref 11.5–15.5)
WBC Count: 5.5 10*3/uL (ref 4.0–10.5)
nRBC: 0 % (ref 0.0–0.2)

## 2023-06-30 LAB — CMP (CANCER CENTER ONLY)
ALT: 35 U/L (ref 0–44)
AST: 30 U/L (ref 15–41)
Albumin: 3.7 g/dL (ref 3.5–5.0)
Alkaline Phosphatase: 52 U/L (ref 38–126)
Anion gap: 7 (ref 5–15)
BUN: 13 mg/dL (ref 8–23)
CO2: 27 mmol/L (ref 22–32)
Calcium: 9.9 mg/dL (ref 8.9–10.3)
Chloride: 105 mmol/L (ref 98–111)
Creatinine: 0.86 mg/dL (ref 0.44–1.00)
GFR, Estimated: >60 mL/min (ref >60)
Glucose, Bld: 128 mg/dL — ABNORMAL HIGH (ref 70–99)
Potassium: 4.6 mmol/L (ref 3.5–5.1)
Sodium: 139 mmol/L (ref 135–145)
Total Bilirubin: 0.6 mg/dL (ref 0.3–1.2)
Total Protein: 7.2 g/dL (ref 6.5–8.1)

## 2023-06-30 LAB — FERRITIN: Ferritin: 274 ng/mL (ref 11–307)

## 2023-07-09 DIAGNOSIS — H4042X1 Glaucoma secondary to eye inflammation, left eye, mild stage: Secondary | ICD-10-CM | POA: Diagnosis not present

## 2023-07-09 DIAGNOSIS — H4041X3 Glaucoma secondary to eye inflammation, right eye, severe stage: Secondary | ICD-10-CM | POA: Diagnosis not present

## 2023-07-09 DIAGNOSIS — H43812 Vitreous degeneration, left eye: Secondary | ICD-10-CM | POA: Diagnosis not present

## 2023-07-09 DIAGNOSIS — H35371 Puckering of macula, right eye: Secondary | ICD-10-CM | POA: Diagnosis not present

## 2023-07-23 ENCOUNTER — Other Ambulatory Visit: Payer: Self-pay | Admitting: Internal Medicine

## 2023-07-23 DIAGNOSIS — E785 Hyperlipidemia, unspecified: Secondary | ICD-10-CM

## 2023-07-23 NOTE — Telephone Encounter (Signed)
Requested Prescriptions  Pending Prescriptions Disp Refills   atorvastatin (LIPITOR) 10 MG tablet [Pharmacy Med Name: ATORVASTATIN 10 MG TABLET] 90 tablet 0    Sig: TAKE 1 TABLET BY MOUTH EVERY DAY     Cardiovascular:  Antilipid - Statins Failed - 07/23/2023  1:39 AM      Failed - Lipid Panel in normal range within the last 12 months    Cholesterol, Total  Date Value Ref Range Status  06/04/2023 144 100 - 199 mg/dL Final   LDL Chol Calc (NIH)  Date Value Ref Range Status  06/04/2023 70 0 - 99 mg/dL Final   HDL  Date Value Ref Range Status  06/04/2023 52 >39 mg/dL Final   Triglycerides  Date Value Ref Range Status  06/04/2023 125 0 - 149 mg/dL Final         Passed - Patient is not pregnant      Passed - Valid encounter within last 12 months    Recent Outpatient Visits           3 weeks ago Enlarged lymph nodes   [place] [facility] at [facility], Nyoka Cowden, MD   1 month ago Annual physical exam   [facility] at [facility], Nyoka Cowden, MD   9 months ago Chronic midline low back pain with bilateral sciatica   [place] [facility] at [facility], Ocie Bob, MD   11 months ago Chronic midline low back pain with bilateral sciatica   [place] [facility] at [facility], Ocie Bob, MD   1 year ago Annual physical exam   [facility] at [facility], Nyoka Cowden, MD

## 2023-08-16 ENCOUNTER — Encounter: Payer: Self-pay | Admitting: Internal Medicine

## 2023-08-18 ENCOUNTER — Ambulatory Visit
Admission: RE | Admit: 2023-08-18 | Discharge: 2023-08-18 | Disposition: A | Discharge status: Home / Self Care | Payer: Medicare HMO | Source: Ambulatory Visit | Attending: Internal Medicine | Admitting: Internal Medicine

## 2023-08-18 DIAGNOSIS — E2839 Other primary ovarian failure: Secondary | ICD-10-CM | POA: Diagnosis not present

## 2023-08-24 DIAGNOSIS — H44113 Panuveitis, bilateral: Secondary | ICD-10-CM | POA: Diagnosis not present

## 2023-08-24 DIAGNOSIS — H30133 Disseminated chorioretinal inflammation, generalized, bilateral: Secondary | ICD-10-CM | POA: Diagnosis not present

## 2023-08-24 DIAGNOSIS — H35373 Puckering of macula, bilateral: Secondary | ICD-10-CM | POA: Diagnosis not present

## 2023-09-24 ENCOUNTER — Other Ambulatory Visit: Payer: Self-pay | Admitting: Internal Medicine

## 2023-09-24 DIAGNOSIS — G8929 Other chronic pain: Secondary | ICD-10-CM

## 2023-09-28 ENCOUNTER — Encounter: Payer: Self-pay | Admitting: Internal Medicine

## 2023-09-28 NOTE — Telephone Encounter (Signed)
 Requested Prescriptions  Pending Prescriptions Disp Refills   DULoxetine  (CYMBALTA ) 30 MG capsule [Pharmacy Med Name: DULOXETINE  HCL DR 30 MG CAP] 90 capsule 0    Sig: TAKE 1 CAPSULE BY MOUTH EVERY DAY     Psychiatry: Antidepressants - SNRI - duloxetine  Passed - 09/28/2023 11:24 AM      Passed - Cr in normal range and within 360 days    Creatinine  Date Value Ref Range Status  06/30/2023 0.86 0.44 - 1.00 mg/dL Final         Passed - eGFR is 30 or above and within 360 days    GFR calc Af Amer  Date Value Ref Range Status  09/24/2020 87 >59 mL/min/1.73 Final    Comment:    **In accordance with recommendations from the NKF-ASN Task force,**   [facility] is in the process of updating its eGFR calculation to the   2021 CKD-EPI creatinine equation that estimates kidney function   without a race variable.    GFR, Estimated  Date Value Ref Range Status  06/30/2023 >60 >60 mL/min Final    Comment:    (NOTE) Calculated using the CKD-EPI Creatinine Equation (2021)    eGFR  Date Value Ref Range Status  06/04/2023 92 >59 mL/min/1.73 Final         Passed - Completed PHQ-2 or PHQ-9 in the last 360 days      Passed - Last BP in normal range    BP Readings from Last 1 Encounters:  06/28/23 132/70         Passed - Valid encounter within last 6 months    Recent Outpatient Visits           3 months ago Enlarged lymph nodes   [place] [facility] at [facility], Leita DEL, MD   3 months ago Annual physical exam   [facility] at [facility], Leita DEL, MD   12 months ago Chronic midline low back pain with bilateral sciatica   [place] [facility] at [facility], Selinda PARAS, MD   1 year ago Chronic midline low back pain with bilateral sciatica    Primary Care & Sports Medicine at [facility], Selinda PARAS, MD   1 year ago Annual physical exam   [facility]  Primary Care & Sports Medicine at [facility], Leita DEL, MD

## 2023-10-28 ENCOUNTER — Other Ambulatory Visit: Payer: Self-pay | Admitting: Internal Medicine

## 2023-10-28 DIAGNOSIS — E785 Hyperlipidemia, unspecified: Secondary | ICD-10-CM

## 2023-10-29 NOTE — Telephone Encounter (Signed)
 Requested Prescriptions  Pending Prescriptions Disp Refills   atorvastatin  (LIPITOR) 10 MG tablet [Pharmacy Med Name: ATORVASTATIN  10 MG TABLET] 90 tablet 1    Sig: TAKE 1 TABLET BY MOUTH EVERY DAY     Cardiovascular:  Antilipid - Statins Failed - 10/29/2023  8:55 AM      Failed - Lipid Panel in normal range within the last 12 months    Cholesterol, Total  Date Value Ref Range Status  06/04/2023 144 100 - 199 mg/dL Final   LDL Chol Calc (NIH)  Date Value Ref Range Status  06/04/2023 70 0 - 99 mg/dL Final   HDL  Date Value Ref Range Status  06/04/2023 52 >39 mg/dL Final   Triglycerides  Date Value Ref Range Status  06/04/2023 125 0 - 149 mg/dL Final         Passed - Patient is not pregnant      Passed - Valid encounter within last 12 months    Recent Outpatient Visits           4 months ago Enlarged lymph nodes   [place] [facility] at [facility], Leita DEL, MD   4 months ago Annual physical exam   [facility] at [facility], Leita DEL, MD   1 year ago Chronic midline low back pain with bilateral sciatica   [place] [facility] at [facility], Selinda PARAS, MD   1 year ago Chronic midline low back pain with bilateral sciatica   [place] [facility] at [facility], Selinda PARAS, MD   1 year ago Annual physical exam   [facility] at [facility], Leita DEL, MD

## 2023-11-12 DIAGNOSIS — H5211 Myopia, right eye: Secondary | ICD-10-CM | POA: Diagnosis not present

## 2023-11-12 DIAGNOSIS — H43812 Vitreous degeneration, left eye: Secondary | ICD-10-CM | POA: Diagnosis not present

## 2023-11-12 DIAGNOSIS — H4042X1 Glaucoma secondary to eye inflammation, left eye, mild stage: Secondary | ICD-10-CM | POA: Diagnosis not present

## 2023-11-12 DIAGNOSIS — Z961 Presence of intraocular lens: Secondary | ICD-10-CM | POA: Diagnosis not present

## 2023-11-12 DIAGNOSIS — H4041X3 Glaucoma secondary to eye inflammation, right eye, severe stage: Secondary | ICD-10-CM | POA: Diagnosis not present

## 2023-11-12 DIAGNOSIS — H35371 Puckering of macula, right eye: Secondary | ICD-10-CM | POA: Diagnosis not present

## 2023-11-12 DIAGNOSIS — H524 Presbyopia: Secondary | ICD-10-CM | POA: Diagnosis not present

## 2023-11-23 DIAGNOSIS — H44113 Panuveitis, bilateral: Secondary | ICD-10-CM | POA: Diagnosis not present

## 2023-11-23 DIAGNOSIS — H35373 Puckering of macula, bilateral: Secondary | ICD-10-CM | POA: Diagnosis not present

## 2023-12-28 ENCOUNTER — Other Ambulatory Visit: Payer: Self-pay

## 2023-12-28 DIAGNOSIS — R7989 Other specified abnormal findings of blood chemistry: Secondary | ICD-10-CM

## 2023-12-29 ENCOUNTER — Inpatient Hospital Stay: Payer: Medicare HMO | Attending: Oncology | Admitting: Oncology

## 2023-12-29 ENCOUNTER — Encounter: Payer: Self-pay | Admitting: Oncology

## 2023-12-29 ENCOUNTER — Inpatient Hospital Stay: Payer: Medicare HMO

## 2023-12-29 VITALS — BP 140/60 | HR 59 | Temp 98.1°F | Resp 19 | Ht 61.0 in | Wt 130.4 lb

## 2023-12-29 DIAGNOSIS — F17210 Nicotine dependence, cigarettes, uncomplicated: Secondary | ICD-10-CM | POA: Insufficient documentation

## 2023-12-29 DIAGNOSIS — D7589 Other specified diseases of blood and blood-forming organs: Secondary | ICD-10-CM | POA: Diagnosis not present

## 2023-12-29 DIAGNOSIS — R7989 Other specified abnormal findings of blood chemistry: Secondary | ICD-10-CM | POA: Diagnosis not present

## 2023-12-29 LAB — CMP (CANCER CENTER ONLY)
ALT: 27 U/L (ref 0–44)
AST: 24 U/L (ref 15–41)
Albumin: 3.8 g/dL (ref 3.5–5.0)
Alkaline Phosphatase: 53 U/L (ref 38–126)
Anion gap: 9 (ref 5–15)
BUN: 16 mg/dL (ref 8–23)
CO2: 25 mmol/L (ref 22–32)
Calcium: 9.7 mg/dL (ref 8.9–10.3)
Chloride: 103 mmol/L (ref 98–111)
Creatinine: 0.65 mg/dL (ref 0.44–1.00)
GFR, Estimated: >60 mL/min (ref >60)
Glucose, Bld: 102 mg/dL — ABNORMAL HIGH (ref 70–99)
Potassium: 3.5 mmol/L (ref 3.5–5.1)
Sodium: 137 mmol/L (ref 135–145)
Total Bilirubin: 0.5 mg/dL (ref 0.0–1.2)
Total Protein: 7.2 g/dL (ref 6.5–8.1)

## 2023-12-29 LAB — CBC WITH DIFFERENTIAL (CANCER CENTER ONLY)
Abs Immature Granulocytes: 0.01 10*3/uL (ref 0.00–0.07)
Basophils Absolute: 0 10*3/uL (ref 0.0–0.1)
Basophils Relative: 1 %
Eosinophils Absolute: 0.3 10*3/uL (ref 0.0–0.5)
Eosinophils Relative: 6 %
HCT: 36.2 % (ref 36.0–46.0)
Hemoglobin: 11.7 g/dL — ABNORMAL LOW (ref 12.0–15.0)
Immature Granulocytes: 0 %
Lymphocytes Relative: 22 %
Lymphs Abs: 1.1 10*3/uL (ref 0.7–4.0)
MCH: 32.5 pg (ref 26.0–34.0)
MCHC: 32.3 g/dL (ref 30.0–36.0)
MCV: 100.6 fL — ABNORMAL HIGH (ref 80.0–100.0)
Monocytes Absolute: 0.5 10*3/uL (ref 0.1–1.0)
Monocytes Relative: 11 %
Neutro Abs: 3 10*3/uL (ref 1.7–7.7)
Neutrophils Relative %: 60 %
Platelet Count: 147 10*3/uL — ABNORMAL LOW (ref 150–400)
RBC: 3.6 MIL/uL — ABNORMAL LOW (ref 3.87–5.11)
RDW: 11.9 % (ref 11.5–15.5)
WBC Count: 4.9 10*3/uL (ref 4.0–10.5)
nRBC: 0 % (ref 0.0–0.2)

## 2023-12-29 LAB — FERRITIN: Ferritin: 162 ng/mL (ref 11–307)

## 2024-01-01 NOTE — Progress Notes (Signed)
 Hematology/Oncology Consult note [facility]  Telephone:([phone] [phone]  Patient Care Team: Sheron Dixons, MD as PCP - General (Internal Medicine) Altha Jester Chandra Come, MD as Consulting Physician (Ophthalmology) Dr. Jeannett Million as Consulting Physician (Retina Ophthalmology) Irby Mannan, MD (Inactive) as Consulting Physician (Gastroenterology)   Name of the patient: Erika Collins  [id number]  [birth year]   Date of visit: 01/01/24  Diagnosis-elevated ferritin likely reactive  Chief complaint/ Reason for visit-routine follow-up of elevated ferritin  Heme/Onc history: Patient is a 78 year old female who was seen by Dr. Tully Gainer after CT scan finding showed fatty liver located as a part of the fatty liver work-up she had a ferritin levels checked which were elevated at 440.  No prior values available for comparison.  LFTs are normal.  CT abdomen has shown findings of fatty liver.  Iron saturation and iron studies was 52%.  Hemochromatosis testing negative.   Interval history-patient is doing well presently and denies any specific complaints at this time.  Appetite and weight have remained stable.  No recent hospitalizations  ECOG PS- 1 Pain scale- 0  Review of systems- Review of Systems  Constitutional:  Negative for chills, fever, malaise/fatigue and weight loss.  HENT:  Negative for congestion, ear discharge and nosebleeds.   Eyes:  Negative for blurred vision.  Respiratory:  Negative for cough, hemoptysis, sputum production, shortness of breath and wheezing.   Cardiovascular:  Negative for chest pain, palpitations, orthopnea and claudication.  Gastrointestinal:  Negative for abdominal pain, blood in stool, constipation, diarrhea, heartburn, melena, nausea and vomiting.  Genitourinary:  Negative for dysuria, flank pain, frequency, hematuria and urgency.  Musculoskeletal:  Negative for back pain, joint pain and myalgias.   Skin:  Negative for rash.  Neurological:  Negative for dizziness, tingling, focal weakness, seizures, weakness and headaches.  Endo/Heme/Allergies:  Does not bruise/bleed easily.  Psychiatric/Behavioral:  Negative for depression and suicidal ideas. The patient does not have insomnia.       No Known Allergies   Past Medical History:  Diagnosis Date   Allergy    Arthritis    Glaucoma    Hx of transient ischemic attack (TIA)    over 25 yrs ago.  no deficits.   Mass of appendix    Osteoarthritis    Vertebral collapse (HCC)    C3,4,5,6,7 compressed over 50 years ago   Vertigo    over 10 yrs ago     Past Surgical History:  Procedure Laterality Date   APPENDECTOMY  09/11/2021   COLONOSCOPY  02/24/2011   normal, repeat 10 years   COLONOSCOPY WITH PROPOFOL N/A 05/13/2021   Procedure: COLONOSCOPY WITH PROPOFOL;  Surgeon: Irby Mannan, MD;  Location[facility];  Service: Endoscopy;  Laterality: N/A;   EYE SURGERY  Medicated Implants Both Eyes   Retina Specialist   LAPAROSCOPIC APPENDECTOMY N/A 09/11/2021   Procedure: APPENDECTOMY LAPAROSCOPIC;  Surgeon: Melvenia Stabs, MD;  Location: [facility];  Service: General;  Laterality: N/A;   POLYPECTOMY  05/13/2021   Procedure: POLYPECTOMY;  Surgeon: Irby Mannan, MD;  Location: [facility];  Service: Endoscopy;;   TUBAL LIGATION      Social History   Socioeconomic History   Marital status: Widowed    Spouse name: Not on file   Number of children: 1   Years of education: some college   Highest education level: Some college, no degree  Occupational History   Occupation: Retired  Tobacco Use  Smoking status: Every Day    Current packs/day: 0.25    Average packs/day: 0.3 packs/day for 58.3 years (14.6 ttl pk-yrs)    Types: Cigarettes    Start date: 54    Passive exposure: Current   Smokeless tobacco: Never   Tobacco comments:    3-4 cigerettes daily  Vaping Use   Vaping status: Never  Used  Substance and Sexual Activity   Alcohol use: Yes    Alcohol/week: 7.0 - 10.0 standard drinks of alcohol    Types: 7 - 10 Glasses of wine per week    Comment: Occasionally enjoy a couple of cocktails on weekend.   Drug use: Never   Sexual activity: Not Currently  Other Topics Concern   Not on file  Social History Narrative   Pt lives alone.    Social Drivers of Corporate investment banker Strain: Low Risk  (06/28/2023)   Overall Financial Resource Strain (CARDIA)    Difficulty of Paying Living Expenses: Not hard at all  Food Insecurity: No Food Insecurity (06/28/2023)   Hunger Vital Sign    Worried About Running Out of Food in the Last Year: Never true    Ran Out of Food in the Last Year: Never true  Transportation Needs: No Transportation Needs (06/28/2023)   PRAPARE - Administrator, Civil Service (Medical): No    Lack of Transportation (Non-Medical): No  Physical Activity: Insufficiently Active (06/28/2023)   Exercise Vital Sign    Days of Exercise per Week: 3 days    Minutes of Exercise per Session: 30 min  Stress: No Stress Concern Present (06/28/2023)   [employer] of Occupational Health - Occupational Stress Questionnaire    Feeling of Stress : Not at all  Social Connections: Unknown (06/28/2023)   Social Connection and Isolation Panel [NHANES]    Frequency of Communication with Friends and Family: Three times a week    Frequency of Social Gatherings with Friends and Family: Three times a week    Attends Religious Services: Patient declined    Active Member of Clubs or Organizations: No    Attends Banker Meetings: Never    Marital Status: Widowed  Recent Concern: Social Connections - Socially Isolated (06/15/2023)   Social Connection and Isolation Panel [NHANES]    Frequency of Communication with Friends and Family: More than three times a week    Frequency of Social Gatherings with Friends and Family: Twice a week    Attends Religious  Services: Never    Database administrator or Organizations: No    Attends Banker Meetings: Never    Marital Status: Widowed  Intimate Partner Violence: Not At Risk (06/15/2023)   Humiliation, Afraid, Rape, and Kick questionnaire    Fear of Current or Ex-Partner: No    Emotionally Abused: No    Physically Abused: No    Sexually Abused: No    Family History  Problem Relation Age of Onset   Heart attack Father    Hyperlipidemia Sister    Breast cancer Neg Hx      Current Outpatient Medications:    aspirin 81 MG tablet, Take 81 mg by mouth daily. , Disp: , Rfl:    atorvastatin (LIPITOR) 10 MG tablet, TAKE 1 TABLET BY MOUTH EVERY DAY, Disp: 90 tablet, Rfl: 1   brimonidine-timolol (COMBIGAN) 0.2-0.5 % ophthalmic solution, Place 1 drop into both eyes every 12 (twelve) hours., Disp: , Rfl:    cholecalciferol (VITAMIN D3) 25  MCG (1000 UNIT) tablet, Take 2,000 Units by mouth daily., Disp: , Rfl:    DULoxetine (CYMBALTA) 30 MG capsule, TAKE 1 CAPSULE BY MOUTH EVERY DAY, Disp: 90 capsule, Rfl: 0   gabapentin (NEURONTIN) 100 MG capsule, TAKE 2 CAPSULES BY MOUTH AT BEDTIME., Disp: 180 capsule, Rfl: 1   RHOPRESSA 0.02 % SOLN, , Disp: , Rfl:    tiZANidine (ZANAFLEX) 2 MG tablet, Take 1 tablet (2 mg total) by mouth every 8 (eight) hours as needed for muscle spasms. Take 1 to 2 tablets every 8 hours as needed, Disp: 20 tablet, Rfl: 0   vitamin C (ASCORBIC ACID) 500 MG tablet, Take 500 mg by mouth daily., Disp: , Rfl:   Physical exam:  Vitals:   12/29/23 1126  BP: (!) 140/60  Pulse: (!) 59  Resp: 19  Temp: 98.1 F (36.7 C)  TempSrc: Tympanic  SpO2: 100%  Weight: 130 lb 6.4 oz (59.1 kg)  Height: 5\' 1"  (1.549 m)   Physical Exam Cardiovascular:     Rate and Rhythm: Normal rate and regular rhythm.     Heart sounds: Normal heart sounds.  Pulmonary:     Effort: Pulmonary effort is normal.     Breath sounds: Normal breath sounds.  Abdominal:     General: Bowel sounds are  normal.     Palpations: Abdomen is soft.  Skin:    General: Skin is warm and dry.  Neurological:     Mental Status: She is alert and oriented to person, place, and time.      I have personally reviewed labs listed below:    Latest Ref Rng & Units 12/29/2023   10:54 AM  CMP  Glucose 70 - 99 mg/dL 161   BUN 8 - 23 mg/dL 16   Creatinine 0.96 - 1.00 mg/dL 0.45   Sodium 409 - 811 mmol/L 137   Potassium 3.5 - 5.1 mmol/L 3.5   Chloride 98 - 111 mmol/L 103   CO2 22 - 32 mmol/L 25   Calcium 8.9 - 10.3 mg/dL 9.7   Total Protein 6.5 - 8.1 g/dL 7.2   Total Bilirubin 0.0 - 1.2 mg/dL 0.5   Alkaline Phos 38 - 126 U/L 53   AST 15 - 41 U/L 24   ALT 0 - 44 U/L 27       Latest Ref Rng & Units 12/29/2023   10:54 AM  CBC  WBC 4.0 - 10.5 K/uL 4.9   Hemoglobin 12.0 - 15.0 g/dL 91.4   Hematocrit 78.2 - 46.0 % 36.2   Platelets 150 - 400 K/uL 147     Assessment and plan- Patient is a 78 y.o. female here for routine follow-up of elevated ferritin  Patient's ferritin levels have remained within normal range over the last 1 year.  Today it is 162.  LFTs are within normal limits she has evidence of chronic macrocytosis possibly secondary to alcohol intake.  B12 folate TSH levels checked in the past have been within normal limits.  Patient wishes to follow-up with her primary care doctor at this time which I think is reasonable.  She can be referred to us  in the future if questions or concerns arise   Visit Diagnosis 1. Elevated ferritin   2. Macrocytosis      Dr. Seretha Dance, MD, MPH [facility] [phone] 01/01/2024 6:01 PM

## 2024-01-10 ENCOUNTER — Other Ambulatory Visit: Payer: Self-pay | Admitting: Internal Medicine

## 2024-01-10 DIAGNOSIS — G8929 Other chronic pain: Secondary | ICD-10-CM

## 2024-01-11 NOTE — Telephone Encounter (Signed)
 Requested Prescriptions  Pending Prescriptions Disp Refills   DULoxetine  (CYMBALTA ) 30 MG capsule [Pharmacy Med Name: DULOXETINE  HCL DR 30 MG CAP] 30 capsule 0    Sig: TAKE 1 CAPSULE BY MOUTH EVERY DAY     Psychiatry: Antidepressants - SNRI - duloxetine  Failed - 01/11/2024 11:58 AM      Failed - Last BP in normal range    BP Readings from Last 1 Encounters:  12/29/23 (!) 140/60         Failed - Valid encounter within last 6 months    Recent Outpatient Visits   None            Passed - Cr in normal range and within 360 days    Creatinine  Date Value Ref Range Status  12/29/2023 0.65 0.44 - 1.00 mg/dL Final         Passed - eGFR is 30 or above and within 360 days    GFR calc Af Amer  Date Value Ref Range Status  09/24/2020 87 >59 mL/min/1.73 Final    Comment:    **In accordance with recommendations from the NKF-ASN Task force,**   [facility] is in the process of updating its eGFR calculation to the   2021 CKD-EPI creatinine equation that estimates kidney function   without a race variable.    GFR, Estimated  Date Value Ref Range Status  12/29/2023 >60 >60 mL/min Final    Comment:    (NOTE) Calculated using the CKD-EPI Creatinine Equation (2021)    eGFR  Date Value Ref Range Status  06/04/2023 92 >59 mL/min/1.73 Final         Passed - Completed PHQ-2 or PHQ-9 in the last 360 days

## 2024-01-26 ENCOUNTER — Encounter: Payer: Self-pay | Admitting: Internal Medicine

## 2024-01-26 ENCOUNTER — Other Ambulatory Visit: Payer: Self-pay | Admitting: Internal Medicine

## 2024-01-26 NOTE — Telephone Encounter (Signed)
 Please review.  KP

## 2024-01-26 NOTE — Progress Notes (Unsigned)
 Date:  01/26/2024   Name:  Erika Collins   DOB:  July 26, 1946   MRN:  [id number]   Chief Complaint: No chief complaint on file.  HPI  Review of Systems   Lab Results  Component Value Date   NA 137 12/29/2023   K 3.5 12/29/2023   CO2 25 12/29/2023   GLUCOSE 102 (H) 12/29/2023   BUN 16 12/29/2023   CREATININE 0.65 12/29/2023   CALCIUM  9.7 12/29/2023   EGFR 92 06/04/2023   GFRNONAA >60 12/29/2023   Lab Results  Component Value Date   CHOL 144 06/04/2023   HDL 52 06/04/2023   LDLCALC 70 06/04/2023   TRIG 125 06/04/2023   CHOLHDL 2.8 06/04/2023   Lab Results  Component Value Date   TSH 1.130 06/01/2022   Lab Results  Component Value Date   HGBA1C 5.6 06/01/2022   Lab Results  Component Value Date   WBC 4.9 12/29/2023   HGB 11.7 (L) 12/29/2023   HCT 36.2 12/29/2023   MCV 100.6 (H) 12/29/2023   PLT 147 (L) 12/29/2023   Lab Results  Component Value Date   ALT 27 12/29/2023   AST 24 12/29/2023   ALKPHOS 53 12/29/2023   BILITOT 0.5 12/29/2023   Lab Results  Component Value Date   VD25OH 24.1 (L) 06/04/2023     Patient Active Problem List   Diagnosis Date Noted   Enlarged lymph nodes 06/28/2023   Elevated ferritin 06/04/2023   Chronic radicular lumbar pain 02/02/2023   Spinal stenosis, lumbar region, with neurogenic claudication 02/02/2023   Lumbar degenerative disc disease 02/02/2023   Lumbar spondylosis 12/08/2022   Chronic pain syndrome 12/08/2022   Other idiopathic scoliosis, lumbar region 12/08/2022   Polyp of transverse colon    Chronic midline low back pain with bilateral sciatica 10/11/2019   OA (osteoarthritis) of finger, unspecified laterality 05/19/2019   Numbness and tingling of foot 05/19/2019   Cervical disc disorder with radiculopathy 05/17/2018   History of TIA (transient ischemic attack) 05/17/2018   Vitamin D  deficiency 05/17/2018   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   Mixed hyperlipidemia 12/13/2017   Tobacco use 12/13/2017    Primary open angle glaucoma 01/13/2013    No Known Allergies  Past Surgical History:  Procedure Laterality Date   APPENDECTOMY  09/11/2021   COLONOSCOPY  02/24/2011   normal, repeat 10 years   COLONOSCOPY WITH PROPOFOL  N/A 05/13/2021   Procedure: COLONOSCOPY WITH PROPOFOL ;  Surgeon: Irby Mannan, MD;  Location: [facility];  Service: Endoscopy;  Laterality: N/A;   EYE SURGERY  Medicated Implants Both Eyes   Retina Specialist   LAPAROSCOPIC APPENDECTOMY N/A 09/11/2021   Procedure: APPENDECTOMY LAPAROSCOPIC;  Surgeon: Melvenia Stabs, MD;  Location: [facility];  Service: General;  Laterality: N/A;   POLYPECTOMY  05/13/2021   Procedure: POLYPECTOMY;  Surgeon: Irby Mannan, MD;  Location: [facility];  Service: Endoscopy;;   TUBAL LIGATION      Social History   Tobacco Use   Smoking status: Every Day    Current packs/day: 0.25    Average packs/day: 0.3 packs/day for 58.3 years (14.6 ttl pk-yrs)    Types: Cigarettes    Start date: 1967    Passive exposure: Current   Smokeless tobacco: Never   Tobacco comments:    3-4 cigerettes daily  Vaping Use   Vaping status: Never Used  Substance Use Topics   Alcohol use: Yes    Alcohol/week: 7.0 - 10.0 standard drinks of  alcohol    Types: 7 - 10 Glasses of wine per week    Comment: Occasionally enjoy a couple of cocktails on weekend.   Drug use: Never     Medication list has been reviewed and updated.  No outpatient medications have been marked as taking for the 01/26/24 encounter (Orders Only) with Sheron Dixons, MD.       06/28/2023    1:38 PM 06/04/2023    8:39 AM 06/01/2022    8:44 AM 05/28/2021    8:42 AM  GAD 7 : Generalized Anxiety Score  Nervous, Anxious, on Edge 0 0 0 0  Control/stop worrying 0 0 0 0  Worry too much - different things 0 0 0 0  Trouble relaxing 0 0 0 0  Restless 0 0 0 0  Easily annoyed or irritable 0 0 0 0  Afraid - awful might happen 0 0 0 0  Total GAD 7 Score 0 0 0  0  Anxiety Difficulty Not difficult at all Not difficult at all Not difficult at all Not difficult at all       06/28/2023    1:38 PM 06/15/2023   11:00 AM 06/04/2023    8:39 AM  Depression screen PHQ 2/9  Decreased Interest 0 0 0  Down, Depressed, Hopeless 0 0 0  PHQ - 2 Score 0 0 0  Altered sleeping 0 0 0  Tired, decreased energy 0 0 0  Change in appetite 0 0 0  Feeling bad or failure about yourself  0 0 0  Trouble concentrating 0 0 0  Moving slowly or fidgety/restless 0 0 0  Suicidal thoughts 0 0 0  PHQ-9 Score 0 0 0  Difficult doing work/chores Not difficult at all Not difficult at all Not difficult at all    BP Readings from Last 3 Encounters:  12/29/23 (!) 140/60  06/28/23 132/70  06/21/23 (!) 169/72    Physical Exam  Wt Readings from Last 3 Encounters:  12/29/23 130 lb 6.4 oz (59.1 kg)  06/28/23 131 lb 6.4 oz (59.6 kg)  06/04/23 131 lb 6.4 oz (59.6 kg)    There were no vitals taken for this visit.  Assessment and Plan:  Problem List Items Addressed This Visit   None   No follow-ups on file.    Sheron Dixons, MD [facility] [place]

## 2024-02-03 ENCOUNTER — Other Ambulatory Visit: Payer: Self-pay | Admitting: Internal Medicine

## 2024-02-03 DIAGNOSIS — G8929 Other chronic pain: Secondary | ICD-10-CM

## 2024-02-07 NOTE — Telephone Encounter (Signed)
 Called to schedule CPE, no answer, left VM to call back to schedule. Patient advised to return in September for CPE at last OV 05/26/23. Will refill medication.

## 2024-02-07 NOTE — Telephone Encounter (Signed)
 Patient advised at last OV ( Sept. 2024) to return in 1 year for CPE. Called to schedule, no answer.  Requested Prescriptions  Pending Prescriptions Disp Refills   DULoxetine  (CYMBALTA ) 30 MG capsule [Pharmacy Med Name: DULOXETINE  HCL DR 30 MG CAP] 90 capsule 1    Sig: TAKE 1 CAPSULE BY MOUTH EVERY DAY     Psychiatry: Antidepressants - SNRI - duloxetine  Failed - 02/07/2024  9:44 AM      Failed - Last BP in normal range    BP Readings from Last 1 Encounters:  12/29/23 (!) 140/60         Failed - Valid encounter within last 6 months    Recent Outpatient Visits   None            Passed - Cr in normal range and within 360 days    Creatinine  Date Value Ref Range Status  12/29/2023 0.65 0.44 - 1.00 mg/dL Final         Passed - eGFR is 30 or above and within 360 days    GFR calc Af Amer  Date Value Ref Range Status  09/24/2020 87 >59 mL/min/1.73 Final    Comment:    **In accordance with recommendations from the NKF-ASN Task force,**   [facility] is in the process of updating its eGFR calculation to the   2021 CKD-EPI creatinine equation that estimates kidney function   without a race variable.    GFR, Estimated  Date Value Ref Range Status  12/29/2023 >60 >60 mL/min Final    Comment:    (NOTE) Calculated using the CKD-EPI Creatinine Equation (2021)    eGFR  Date Value Ref Range Status  06/04/2023 92 >59 mL/min/1.73 Final         Passed - Completed PHQ-2 or PHQ-9 in the last 360 days

## 2024-02-11 DIAGNOSIS — H401113 Primary open-angle glaucoma, right eye, severe stage: Secondary | ICD-10-CM | POA: Diagnosis not present

## 2024-02-11 DIAGNOSIS — Z7982 Long term (current) use of aspirin: Secondary | ICD-10-CM | POA: Diagnosis not present

## 2024-02-11 DIAGNOSIS — F17200 Nicotine dependence, unspecified, uncomplicated: Secondary | ICD-10-CM | POA: Diagnosis not present

## 2024-02-11 DIAGNOSIS — H401133 Primary open-angle glaucoma, bilateral, severe stage: Secondary | ICD-10-CM | POA: Diagnosis not present

## 2024-02-11 DIAGNOSIS — Z79899 Other long term (current) drug therapy: Secondary | ICD-10-CM | POA: Diagnosis not present

## 2024-02-11 DIAGNOSIS — I639 Cerebral infarction, unspecified: Secondary | ICD-10-CM | POA: Diagnosis not present

## 2024-02-11 DIAGNOSIS — Z8673 Personal history of transient ischemic attack (TIA), and cerebral infarction without residual deficits: Secondary | ICD-10-CM | POA: Diagnosis not present

## 2024-02-11 DIAGNOSIS — H35371 Puckering of macula, right eye: Secondary | ICD-10-CM | POA: Diagnosis not present

## 2024-02-22 DIAGNOSIS — H35373 Puckering of macula, bilateral: Secondary | ICD-10-CM | POA: Diagnosis not present

## 2024-02-22 DIAGNOSIS — H44113 Panuveitis, bilateral: Secondary | ICD-10-CM | POA: Diagnosis not present

## 2024-04-24 ENCOUNTER — Other Ambulatory Visit: Payer: Self-pay | Admitting: Internal Medicine

## 2024-04-24 DIAGNOSIS — E785 Hyperlipidemia, unspecified: Secondary | ICD-10-CM

## 2024-04-25 NOTE — Telephone Encounter (Signed)
 Requested Prescriptions  Pending Prescriptions Disp Refills   atorvastatin  (LIPITOR) 10 MG tablet [Pharmacy Med Name: ATORVASTATIN  10 MG TABLET] 90 tablet 0    Sig: TAKE 1 TABLET BY MOUTH EVERY DAY     Cardiovascular:  Antilipid - Statins Failed - 04/25/2024 11:00 AM      Failed - Valid encounter within last 12 months    Recent Outpatient Visits   None            Failed - Lipid Panel in normal range within the last 12 months    Cholesterol, Total  Date Value Ref Range Status  06/04/2023 144 100 - 199 mg/dL Final   LDL Chol Calc (NIH)  Date Value Ref Range Status  06/04/2023 70 0 - 99 mg/dL Final   HDL  Date Value Ref Range Status  06/04/2023 52 >39 mg/dL Final   Triglycerides  Date Value Ref Range Status  06/04/2023 125 0 - 149 mg/dL Final         Passed - Patient is not pregnant

## 2024-05-16 ENCOUNTER — Other Ambulatory Visit: Payer: Self-pay | Admitting: Internal Medicine

## 2024-05-16 DIAGNOSIS — Z1231 Encounter for screening mammogram for malignant neoplasm of breast: Secondary | ICD-10-CM

## 2024-05-23 DIAGNOSIS — H35373 Puckering of macula, bilateral: Secondary | ICD-10-CM | POA: Diagnosis not present

## 2024-05-23 DIAGNOSIS — H44113 Panuveitis, bilateral: Secondary | ICD-10-CM | POA: Diagnosis not present

## 2024-06-15 ENCOUNTER — Encounter: Payer: Self-pay | Admitting: Internal Medicine

## 2024-06-15 ENCOUNTER — Ambulatory Visit (INDEPENDENT_AMBULATORY_CARE_PROVIDER_SITE_OTHER): Admitting: Internal Medicine

## 2024-06-15 VITALS — BP 122/68 | HR 93 | Ht 61.0 in | Wt 121.0 lb

## 2024-06-15 DIAGNOSIS — G8929 Other chronic pain: Secondary | ICD-10-CM

## 2024-06-15 DIAGNOSIS — E785 Hyperlipidemia, unspecified: Secondary | ICD-10-CM

## 2024-06-15 DIAGNOSIS — D7589 Other specified diseases of blood and blood-forming organs: Secondary | ICD-10-CM | POA: Insufficient documentation

## 2024-06-15 DIAGNOSIS — Z1231 Encounter for screening mammogram for malignant neoplasm of breast: Secondary | ICD-10-CM

## 2024-06-15 DIAGNOSIS — Z0000 Encounter for general adult medical examination without abnormal findings: Secondary | ICD-10-CM

## 2024-06-15 DIAGNOSIS — E782 Mixed hyperlipidemia: Secondary | ICD-10-CM | POA: Diagnosis not present

## 2024-06-15 DIAGNOSIS — M5442 Lumbago with sciatica, left side: Secondary | ICD-10-CM

## 2024-06-15 DIAGNOSIS — R7989 Other specified abnormal findings of blood chemistry: Secondary | ICD-10-CM

## 2024-06-15 DIAGNOSIS — M5441 Lumbago with sciatica, right side: Secondary | ICD-10-CM

## 2024-06-15 DIAGNOSIS — E559 Vitamin D deficiency, unspecified: Secondary | ICD-10-CM | POA: Diagnosis not present

## 2024-06-15 MED ORDER — ATORVASTATIN CALCIUM 10 MG PO TABS: 10.0000 mg | ORAL_TABLET | Freq: Every day | ORAL | 1 refills | Status: AC

## 2024-06-15 MED ORDER — DULOXETINE HCL 30 MG PO CPEP: 30.0000 mg | ORAL_CAPSULE | Freq: Every day | ORAL | 1 refills | Status: AC

## 2024-06-15 NOTE — Assessment & Plan Note (Signed)
 Doing fairly well - no longer seeing Ortho  Taking Cymbalta  30 mg daily with good pain control.

## 2024-06-15 NOTE — Assessment & Plan Note (Signed)
 LDL is  Lab Results  Component Value Date   LDLCALC 70 06/04/2023   Currently taking atorvastatin .  No medication side effects or other concerns. Recommended LDL goal is < 70.

## 2024-06-15 NOTE — Assessment & Plan Note (Signed)
 Normal levels being monitored previously by Hematology.

## 2024-06-15 NOTE — Assessment & Plan Note (Signed)
 Followed by Hematology - felt to be possibly related to alcohol intake. Released from hematology to follow up only prn.

## 2024-06-15 NOTE — Assessment & Plan Note (Signed)
Continue daily supplementation

## 2024-06-15 NOTE — Progress Notes (Signed)
 Date:  06/15/2024   Name:  Erika Collins   DOB:  1946/09/04   MRN:  [id number]   Chief Complaint: Annual Exam Erika Collins is a 78 y.o. female who presents today for her Complete Annual Exam. She feels well. She reports exercising some. She reports she is sleeping fairly well. Breast complaints none.  Health Maintenance  Topic Date Due   Breast Cancer Screening  04/14/2024   Medicare Annual Wellness Visit  06/14/2024   Flu Shot  12/19/2024*   DTaP/Tdap/Td vaccine (2 - Td or Tdap) 01/16/2030   Pneumococcal Vaccine for age over 11  Completed   DEXA scan (bone density measurement)  Completed   Hepatitis C Screening  Completed   HPV Vaccine  Aged Out   Meningitis B Vaccine  Aged Out   Colon Cancer Screening  Discontinued   COVID-19 Vaccine  Discontinued   Zoster (Shingles) Vaccine  Discontinued  *Topic was postponed. The date shown is not the original due date.    Hyperlipidemia This is a chronic problem. Pertinent negatives include no chest pain, myalgias or shortness of breath. Current antihyperlipidemic treatment includes statins.  Back Pain This is a recurrent problem. The problem occurs intermittently. The pain is present in the lumbar spine. The pain is mild. Pertinent negatives include no abdominal pain, chest pain, headaches or weakness.  Macrocytosis and elevated Ferritin - seen by Hematology, felt to be due to alcohol use.  Recent ferritin normal.  Now released to PCP follow up.   Review of Systems  Constitutional:  Negative for fatigue and unexpected weight change.  HENT:  Negative for trouble swallowing.   Eyes:  Negative for visual disturbance.  Respiratory:  Negative for cough, chest tightness, shortness of breath and wheezing.   Cardiovascular:  Negative for chest pain, palpitations and leg swelling.  Gastrointestinal:  Negative for abdominal pain, constipation and diarrhea.  Musculoskeletal:  Positive for back pain. Negative for arthralgias and myalgias.   Neurological:  Negative for dizziness, weakness, light-headedness and headaches.     Lab Results  Component Value Date   NA 137 12/29/2023   K 3.5 12/29/2023   CO2 25 12/29/2023   GLUCOSE 102 (H) 12/29/2023   BUN 16 12/29/2023   CREATININE 0.65 12/29/2023   CALCIUM  9.7 12/29/2023   EGFR 92 06/04/2023   GFRNONAA >60 12/29/2023   Lab Results  Component Value Date   CHOL 144 06/04/2023   HDL 52 06/04/2023   LDLCALC 70 06/04/2023   TRIG 125 06/04/2023   CHOLHDL 2.8 06/04/2023   Lab Results  Component Value Date   TSH 1.130 06/01/2022   Lab Results  Component Value Date   HGBA1C 5.6 06/01/2022   Lab Results  Component Value Date   WBC 4.9 12/29/2023   HGB 11.7 (L) 12/29/2023   HCT 36.2 12/29/2023   MCV 100.6 (H) 12/29/2023   PLT 147 (L) 12/29/2023   Lab Results  Component Value Date   ALT 27 12/29/2023   AST 24 12/29/2023   ALKPHOS 53 12/29/2023   BILITOT 0.5 12/29/2023   Lab Results  Component Value Date   VD25OH 24.1 (L) 06/04/2023     Patient Active Problem List   Diagnosis Date Noted   Alcohol-related macrocytosis 06/15/2024   Enlarged lymph nodes 06/28/2023   Elevated ferritin 06/04/2023   Chronic midline low back pain with bilateral sciatica 10/11/2019   OA (osteoarthritis) of finger, unspecified laterality 05/19/2019   Cervical disc disorder with radiculopathy 05/17/2018   History of  TIA (transient ischemic attack) 05/17/2018   Vitamin D  deficiency 05/17/2018   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   Mixed hyperlipidemia 12/13/2017   Tobacco use 12/13/2017   Primary open angle glaucoma 01/13/2013    No Known Allergies  Past Surgical History:  Procedure Laterality Date   APPENDECTOMY  09/11/2021   COLONOSCOPY  02/24/2011   normal, repeat 10 years   COLONOSCOPY WITH PROPOFOL  N/A 05/13/2021   Procedure: COLONOSCOPY WITH PROPOFOL ;  Surgeon: Janalyn Keene NOVAK, MD;  Location: [facility];  Service: Endoscopy;  Laterality: N/A;   EYE  SURGERY  Medicated Implants Both Eyes   Retina Specialist   LAPAROSCOPIC APPENDECTOMY N/A 09/11/2021   Procedure: APPENDECTOMY LAPAROSCOPIC;  Surgeon: Teresa Lonni HERO, MD;  Location: [facility];  Service: General;  Laterality: N/A;   POLYPECTOMY  05/13/2021   Procedure: POLYPECTOMY;  Surgeon: Janalyn Keene NOVAK, MD;  Location: [facility];  Service: Endoscopy;;   TUBAL LIGATION      Social History   Tobacco Use   Smoking status: Every Day    Current packs/day: 0.25    Average packs/day: 0.3 packs/day for 68.4 years (17.6 ttl pk-yrs)    Types: Cigarettes    Start date: 1967    Passive exposure: Current   Smokeless tobacco: Never   Tobacco comments:    3-4 cigerettes daily  Vaping Use   Vaping status: Never Used  Substance Use Topics   Alcohol use: Yes    Alcohol/week: 7.0 - 10.0 standard drinks of alcohol    Types: 7 - 10 Glasses of wine per week    Comment: Occasionally enjoy a couple of cocktails on weekend.   Drug use: Never     Medication list has been reviewed and updated.  Current Meds  Medication Sig   aspirin 81 MG tablet Take 81 mg by mouth daily.    brimonidine-timolol (COMBIGAN) 0.2-0.5 % ophthalmic solution Place 1 drop into both eyes every 12 (twelve) hours.   cholecalciferol (VITAMIN D3) 25 MCG (1000 UNIT) tablet Take 2,000 Units by mouth daily.   cyanocobalamin  (VITAMIN B12) 500 MCG tablet Take 500 mcg by mouth daily.   diclofenac Sodium (VOLTAREN ARTHRITIS PAIN) 1 % GEL Apply 2 g topically 4 (four) times daily.   DULoxetine  (CYMBALTA ) 30 MG capsule Take 1 capsule (30 mg total) by mouth daily.   vitamin C (ASCORBIC ACID) 500 MG tablet Take 500 mg by mouth daily.   [DISCONTINUED] atorvastatin  (LIPITOR) 10 MG tablet TAKE 1 TABLET BY MOUTH EVERY DAY   [DISCONTINUED] DULoxetine  (CYMBALTA ) 30 MG capsule TAKE 1 CAPSULE BY MOUTH EVERY DAY       06/15/2024    9:32 AM 06/28/2023    1:38 PM 06/04/2023    8:39 AM 06/01/2022    8:44 AM  GAD 7 :  Generalized Anxiety Score  Nervous, Anxious, on Edge 0 0 0 0  Control/stop worrying 0 0 0 0  Worry too much - different things 0 0 0 0  Trouble relaxing 0 0 0 0  Restless 0 0 0 0  Easily annoyed or irritable 0 0 0 0  Afraid - awful might happen 0 0 0 0  Total GAD 7 Score 0 0 0 0  Anxiety Difficulty Not difficult at all Not difficult at all Not difficult at all Not difficult at all       06/15/2024    9:32 AM 06/28/2023    1:38 PM 06/15/2023   11:00 AM  Depression screen [facility] 2/9  Decreased Interest 0 0 0  Down, Depressed, Hopeless 0 0 0  PHQ - 2 Score 0 0 0  Altered sleeping 0 0 0  Tired, decreased energy 0 0 0  Change in appetite 0 0 0  Feeling bad or failure about yourself  0 0 0  Trouble concentrating 0 0 0  Moving slowly or fidgety/restless 0 0 0  Suicidal thoughts 0 0 0  PHQ-9 Score 0 0 0  Difficult doing work/chores Not difficult at all Not difficult at all Not difficult at all    BP Readings from Last 3 Encounters:  06/15/24 122/68  12/29/23 (!) 140/60  06/28/23 132/70    Physical Exam Vitals and nursing note reviewed.  Constitutional:      General: She is not in acute distress.    Appearance: She is well-developed.  HENT:     Head: Normocephalic and atraumatic.     Right Ear: Tympanic membrane and ear canal normal.     Left Ear: Tympanic membrane and ear canal normal.     Nose:     Right Sinus: No maxillary sinus tenderness.     Left Sinus: No maxillary sinus tenderness.  Eyes:     General: No scleral icterus.       Right eye: No discharge.        Left eye: No discharge.     Conjunctiva/sclera: Conjunctivae normal.  Neck:     Thyroid : No thyromegaly.     Vascular: No carotid bruit.  Cardiovascular:     Rate and Rhythm: Normal rate and regular rhythm.     Pulses: Normal pulses.     Heart sounds: Normal heart sounds.  Pulmonary:     Effort: Pulmonary effort is normal. No respiratory distress.     Breath sounds: No wheezing.  Abdominal:     General:  Bowel sounds are normal.     Palpations: Abdomen is soft.     Tenderness: There is no abdominal tenderness.  Musculoskeletal:     Cervical back: Normal range of motion. No erythema.     Right lower leg: No edema.     Left lower leg: No edema.  Lymphadenopathy:     Cervical: No cervical adenopathy.  Skin:    General: Skin is warm and dry.     Capillary Refill: Capillary refill takes less than 2 seconds.     Findings: No rash.  Neurological:     General: No focal deficit present.     Mental Status: She is alert and oriented to person, place, and time.     Cranial Nerves: No cranial nerve deficit.     Sensory: No sensory deficit.     Deep Tendon Reflexes: Reflexes are normal and symmetric.  Psychiatric:        Attention and Perception: Attention normal.        Mood and Affect: Mood normal.     Wt Readings from Last 3 Encounters:  06/15/24 121 lb (54.9 kg)  12/29/23 130 lb 6.4 oz (59.1 kg)  06/28/23 131 lb 6.4 oz (59.6 kg)    BP 122/68   Pulse 93   Ht 5' 1 (1.549 m)   Wt 121 lb (54.9 kg)   SpO2 96%   BMI 22.86 kg/m   Assessment and Plan:  Problem List Items Addressed This Visit       Unprioritized   Alcohol-related macrocytosis   Followed by Hematology - felt to be possibly related to alcohol intake. Released from hematology to  follow up only prn.       Relevant Orders   CBC with Differential/Platelet   TSH   Chronic midline low back pain with bilateral sciatica (Chronic)   Doing fairly well - no longer seeing Ortho  Taking Cymbalta  30 mg daily with good pain control.       Relevant Medications   DULoxetine  (CYMBALTA ) 30 MG capsule   Elevated ferritin   Normal levels being monitored previously by Hematology.      Relevant Orders   Ferritin   Mixed hyperlipidemia (Chronic)   LDL is  Lab Results  Component Value Date   LDLCALC 70 06/04/2023   Currently taking atorvastatin .  No medication side effects or other concerns. Recommended LDL goal is <  70.       Relevant Medications   atorvastatin  (LIPITOR) 10 MG tablet   Other Relevant Orders   Comprehensive metabolic panel with GFR   Lipid panel   Vitamin D  deficiency (Chronic)   Continue daily supplementation.      Relevant Orders   VITAMIN D  25 Hydroxy (Vit-D Deficiency, Fractures)   Other Visit Diagnoses       Annual physical exam    -  Primary   aged out of colonoscopy up to date on immunizations     Encounter for screening mammogram for breast cancer       scheduled     Mild hyperlipidemia       Relevant Medications   atorvastatin  (LIPITOR) 10 MG tablet       Return in about 6 months (around 12/13/2024) for TOC lipids, macrocytosis/ferritin  Dr. Lemon.    Leita HILARIO Adie, MD [facility] [place]

## 2024-06-16 ENCOUNTER — Ambulatory Visit: Payer: Self-pay | Admitting: Internal Medicine

## 2024-06-16 LAB — CBC WITH DIFFERENTIAL/PLATELET
Basophils Absolute: 0 x10E3/uL (ref 0.0–0.2)
Basos: 1 %
EOS (ABSOLUTE): 0.4 x10E3/uL (ref 0.0–0.4)
Eos: 6 %
Hematocrit: 37.4 % (ref 34.0–46.6)
Hemoglobin: 12.4 g/dL (ref 11.1–15.9)
Immature Grans (Abs): 0 x10E3/uL (ref 0.0–0.1)
Immature Granulocytes: 0 %
Lymphocytes Absolute: 1.6 x10E3/uL (ref 0.7–3.1)
Lymphs: 24 %
MCH: 34.3 pg — ABNORMAL HIGH (ref 26.6–33.0)
MCHC: 33.2 g/dL (ref 31.5–35.7)
MCV: 104 fL — ABNORMAL HIGH (ref 79–97)
Monocytes Absolute: 0.6 x10E3/uL (ref 0.1–0.9)
Monocytes: 9 %
Neutrophils Absolute: 4 x10E3/uL (ref 1.4–7.0)
Neutrophils: 60 %
Platelets: 193 x10E3/uL (ref 150–450)
RBC: 3.61 x10E6/uL — ABNORMAL LOW (ref 3.77–5.28)
RDW: 11.9 % (ref 11.7–15.4)
WBC: 6.6 x10E3/uL (ref 3.4–10.8)

## 2024-06-16 LAB — COMPREHENSIVE METABOLIC PANEL WITH GFR
ALT: 27 IU/L (ref 0–32)
AST: 22 IU/L (ref 0–40)
Albumin: 4.3 g/dL (ref 3.8–4.8)
Alkaline Phosphatase: 62 IU/L (ref 49–135)
BUN/Creatinine Ratio: 22 (ref 12–28)
BUN: 14 mg/dL (ref 8–27)
Bilirubin Total: 0.6 mg/dL (ref 0.0–1.2)
CO2: 23 mmol/L (ref 20–29)
Calcium: 10.2 mg/dL (ref 8.7–10.3)
Chloride: 101 mmol/L (ref 96–106)
Creatinine, Ser: 0.65 mg/dL (ref 0.57–1.00)
Globulin, Total: 2.9 g/dL (ref 1.5–4.5)
Glucose: 98 mg/dL (ref 70–99)
Potassium: 4.3 mmol/L (ref 3.5–5.2)
Sodium: 140 mmol/L (ref 134–144)
Total Protein: 7.2 g/dL (ref 6.0–8.5)
eGFR: 90 mL/min/1.73 (ref >59)

## 2024-06-16 LAB — LIPID PANEL
Chol/HDL Ratio: 2.8 ratio (ref 0.0–4.4)
Cholesterol, Total: 153 mg/dL (ref 100–199)
HDL: 55 mg/dL (ref >39)
LDL Chol Calc (NIH): 76 mg/dL (ref 0–99)
Triglycerides: 127 mg/dL (ref 0–149)
VLDL Cholesterol Cal: 22 mg/dL (ref 5–40)

## 2024-06-16 LAB — TSH: TSH: 1.02 u[IU]/mL (ref 0.450–4.500)

## 2024-06-16 LAB — FERRITIN: Ferritin: 379 ng/mL — ABNORMAL HIGH (ref 15–150)

## 2024-06-16 LAB — VITAMIN D 25 HYDROXY (VIT D DEFICIENCY, FRACTURES): Vit D, 25-Hydroxy: 31.8 ng/mL (ref 30.0–100.0)

## 2024-06-19 ENCOUNTER — Ambulatory Visit
Admission: RE | Admit: 2024-06-19 | Discharge: 2024-06-19 | Disposition: A | Discharge status: Home / Self Care | Source: Ambulatory Visit | Attending: Internal Medicine | Admitting: Internal Medicine

## 2024-06-19 DIAGNOSIS — Z1231 Encounter for screening mammogram for malignant neoplasm of breast: Secondary | ICD-10-CM | POA: Insufficient documentation

## 2024-06-22 ENCOUNTER — Ambulatory Visit

## 2024-06-28 ENCOUNTER — Telehealth: Payer: Self-pay

## 2024-06-28 NOTE — Telephone Encounter (Signed)
 Please call and schedule to establish care.   Copied from CRM #[id number]. Topic: Appointments - Transfer of Care >> Jun 28, 2024  9:49 AM Joesph NOVAK wrote: Pt is requesting to transfer FROM: Leita Adie Pt is requesting to transfer TO: Susan Ziglar Reason for requested transfer: Heard good things about DR.Ziglar It is the responsibility of the team the patient would like to transfer to (Dr. Ziglar) to reach out to the patient if for any reason this transfer is not acceptable.

## 2024-07-07 ENCOUNTER — Ambulatory Visit (INDEPENDENT_AMBULATORY_CARE_PROVIDER_SITE_OTHER): Admitting: Family Medicine

## 2024-07-07 ENCOUNTER — Encounter: Payer: Self-pay | Admitting: Family Medicine

## 2024-07-07 VITALS — BP 155/75 | HR 60 | Temp 97.7°F | Resp 18 | Ht 61.0 in | Wt 123.0 lb

## 2024-07-07 DIAGNOSIS — M85851 Other specified disorders of bone density and structure, right thigh: Secondary | ICD-10-CM

## 2024-07-07 DIAGNOSIS — M85852 Other specified disorders of bone density and structure, left thigh: Secondary | ICD-10-CM

## 2024-07-07 NOTE — Progress Notes (Unsigned)
 New Patient Office Visit  Subjective    Patient ID: Erika Collins, female    DOB: May 23, 1946  Age: 78 y.o. MRN: [id number]  CC:  Chief Complaint  Patient presents with   Establish Care    HPI Erika Collins presents to establish care   Outpatient Encounter Medications as of 07/07/2024  Medication Sig   aspirin 81 MG tablet Take 81 mg by mouth daily.    atorvastatin  (LIPITOR) 10 MG tablet Take 1 tablet (10 mg total) by mouth daily.   brimonidine-timolol (COMBIGAN) 0.2-0.5 % ophthalmic solution Place 1 drop into both eyes every 12 (twelve) hours.   cholecalciferol (VITAMIN D3) 25 MCG (1000 UNIT) tablet Take 2,000 Units by mouth daily.   cyanocobalamin  (VITAMIN B12) 500 MCG tablet Take 500 mcg by mouth daily.   diclofenac Sodium (VOLTAREN ARTHRITIS PAIN) 1 % GEL Apply 2 g topically 4 (four) times daily.   DULoxetine  (CYMBALTA ) 30 MG capsule Take 1 capsule (30 mg total) by mouth daily.   vitamin C (ASCORBIC ACID) 500 MG tablet Take 500 mg by mouth daily.   No facility-administered encounter medications on file as of 07/07/2024.    Past Medical History:  Diagnosis Date   Allergy    Arthritis    Glaucoma    Hx of transient ischemic attack (TIA)    over 25 yrs ago.  no deficits.   Mass of appendix    Osteoarthritis    Polyp of transverse colon    Vertebral collapse (HCC)    C3,4,5,6,7 compressed over 50 years ago   Vertigo    over 10 yrs ago    Past Surgical History:  Procedure Laterality Date   APPENDECTOMY  09/11/2021   COLONOSCOPY  02/24/2011   normal, repeat 10 years   COLONOSCOPY WITH PROPOFOL  N/A 05/13/2021   Procedure: COLONOSCOPY WITH PROPOFOL ;  Surgeon: Janalyn Keene NOVAK, MD;  Location: [facility];  Service: Endoscopy;  Laterality: N/A;   EYE SURGERY  Medicated Implants Both Eyes   Retina Specialist   LAPAROSCOPIC APPENDECTOMY N/A 09/11/2021   Procedure: APPENDECTOMY LAPAROSCOPIC;  Surgeon: Teresa Lonni HERO, MD;  Location: [facility];  Service:  General;  Laterality: N/A;   POLYPECTOMY  05/13/2021   Procedure: POLYPECTOMY;  Surgeon: Janalyn Keene NOVAK, MD;  Location: [facility];  Service: Endoscopy;;   TUBAL LIGATION      Family History  Problem Relation Age of Onset   Heart attack Father    Hyperlipidemia Sister    Breast cancer Neg Hx     Social History   Socioeconomic History   Marital status: Widowed    Spouse name: Not on file   Number of children: 1   Years of education: some college   Highest education level: Some college, no degree  Occupational History   Occupation: Retired  Tobacco Use   Smoking status: Every Day    Current packs/day: 0.25    Average packs/day: 0.3 packs/day for 68.5 years (17.6 ttl pk-yrs)    Types: Cigarettes    Start date: 1967    Passive exposure: Current   Smokeless tobacco: Never   Tobacco comments:    3-4 cigerettes daily  Vaping Use   Vaping status: Never Used  Substance and Sexual Activity   Alcohol use: Yes    Alcohol/week: 7.0 - 10.0 standard drinks of alcohol    Types: 7 - 10 Glasses of wine per week    Comment: Occasionally enjoy a couple of cocktails on weekend.   Drug  use: Never   Sexual activity: Not Currently  Other Topics Concern   Not on file  Social History Narrative   Pt lives alone.    Social Drivers of Corporate investment banker Strain: Low Risk  (07/03/2024)   Overall Financial Resource Strain (CARDIA)    Difficulty of Paying Living Expenses: Not hard at all  Food Insecurity: No Food Insecurity (07/03/2024)   Hunger Vital Sign    Worried About Running Out of Food in the Last Year: Never true    Ran Out of Food in the Last Year: Never true  Transportation Needs: Unknown (07/03/2024)   PRAPARE - Administrator, Civil Service (Medical): No    Lack of Transportation (Non-Medical): Not on file  Physical Activity: Sufficiently Active (07/03/2024)   Exercise Vital Sign    Days of Exercise per Week: 4 days    Minutes of Exercise  per Session: 50 min  Recent Concern: Physical Activity - Insufficiently Active (06/12/2024)   Exercise Vital Sign    Days of Exercise per Week: 3 days    Minutes of Exercise per Session: 30 min  Stress: No Stress Concern Present (07/03/2024)   [employer] of Occupational Health - Occupational Stress Questionnaire    Feeling of Stress: Not at all  Social Connections: Socially Isolated (07/03/2024)   Social Connection and Isolation Panel    Frequency of Communication with Friends and Family: Twice a week    Frequency of Social Gatherings with Friends and Family: Twice a week    Attends Religious Services: Never    Database administrator or Organizations: No    Attends Engineer, structural: Not on file    Marital Status: Widowed  Intimate Partner Violence: Not At Risk (06/15/2023)   Humiliation, Afraid, Rape, and Kick questionnaire    Fear of Current or Ex-Partner: No    Emotionally Abused: No    Physically Abused: No    Sexually Abused: No    ROS      Objective   BP (!) 166/73 (BP Location: Left Arm, Patient Position: Sitting, Cuff Size: Normal)   Pulse 67   Temp 97.7 F (36.5 C) (Oral)   Resp 18   Ht 5' 1 (1.549 m)   Wt 123 lb (55.8 kg)   SpO2 96%   BMI 23.24 kg/m  {Vitals History (Optional):23777}  Physical Exam ***  {Perform Simple Foot Exam  Perform Detailed exam:1} {Insert foot Exam (Optional):30965}   {Labs (Optional):23779}  The 10-year ASCVD risk score (Arnett DK, et al., 2019) is: 43.3%     Assessment & Plan:  There are no diagnoses linked to this encounter.  No follow-ups on file.   Sharon Rubis K Moorea Boissonneault, MD

## 2024-07-13 DIAGNOSIS — M8580 Other specified disorders of bone density and structure, unspecified site: Secondary | ICD-10-CM | POA: Insufficient documentation

## 2024-07-13 NOTE — Assessment & Plan Note (Addendum)
 DEXA 06/04/2023: FN left T = -1.5, left forearm T= -2.2. 10-year probability of major osteoporotic fracture 13.4%, 10-year risk of osteoporotic hip fracture 4.7%.  Will check her vitamin D  level.  Encouraged her to take calcium  600 mg twice daily.

## 2024-07-26 ENCOUNTER — Ambulatory Visit

## 2024-08-01 DIAGNOSIS — D485 Neoplasm of uncertain behavior of skin: Secondary | ICD-10-CM | POA: Diagnosis not present

## 2024-08-01 DIAGNOSIS — H61002 Unspecified perichondritis of left external ear: Secondary | ICD-10-CM | POA: Diagnosis not present

## 2024-08-01 DIAGNOSIS — L570 Actinic keratosis: Secondary | ICD-10-CM | POA: Diagnosis not present

## 2024-08-01 DIAGNOSIS — C4441 Basal cell carcinoma of skin of scalp and neck: Secondary | ICD-10-CM | POA: Diagnosis not present

## 2024-08-11 DIAGNOSIS — H35371 Puckering of macula, right eye: Secondary | ICD-10-CM | POA: Diagnosis not present

## 2024-08-11 DIAGNOSIS — H4041X3 Glaucoma secondary to eye inflammation, right eye, severe stage: Secondary | ICD-10-CM | POA: Diagnosis not present

## 2024-08-11 DIAGNOSIS — H4042X1 Glaucoma secondary to eye inflammation, left eye, mild stage: Secondary | ICD-10-CM | POA: Diagnosis not present

## 2024-08-11 DIAGNOSIS — H43812 Vitreous degeneration, left eye: Secondary | ICD-10-CM | POA: Diagnosis not present

## 2024-08-11 DIAGNOSIS — Z961 Presence of intraocular lens: Secondary | ICD-10-CM | POA: Diagnosis not present

## 2024-10-26 ENCOUNTER — Ambulatory Visit: Admitting: Student in an Organized Health Care Education/Training Program

## 2024-12-13 ENCOUNTER — Encounter: Admitting: Student

## 2025-01-05 ENCOUNTER — Ambulatory Visit: Admitting: Family Medicine
